# Patient Record
Sex: Female | Born: 2000 | Race: White | Hispanic: No | Marital: Single | Smoking: Never smoker
Health system: Southern US, Community
[De-identification: ages and names within clinical notes are randomized; demographics above are authoritative.]

---

## 2019-12-20 ENCOUNTER — Emergency Department (HOSPITAL_COMMUNITY)
Admission: EM | Admit: 2019-12-20 | Discharge: 2019-12-20 | Disposition: A | Discharge status: Home / Self Care | Payer: No Typology Code available for payment source | Attending: Emergency Medicine | Admitting: Emergency Medicine

## 2019-12-20 ENCOUNTER — Encounter (HOSPITAL_COMMUNITY): Payer: Self-pay | Admitting: Emergency Medicine

## 2019-12-20 ENCOUNTER — Other Ambulatory Visit: Payer: Self-pay

## 2019-12-20 ENCOUNTER — Emergency Department (HOSPITAL_COMMUNITY): Payer: No Typology Code available for payment source

## 2019-12-20 DIAGNOSIS — T407X5A Adverse effect of cannabis (derivatives), initial encounter: Secondary | ICD-10-CM | POA: Diagnosis not present

## 2019-12-20 DIAGNOSIS — R002 Palpitations: Secondary | ICD-10-CM | POA: Diagnosis present

## 2019-12-20 DIAGNOSIS — R443 Hallucinations, unspecified: Secondary | ICD-10-CM | POA: Diagnosis not present

## 2019-12-20 MED ORDER — SODIUM CHLORIDE 0.9% FLUSH: 3.0000 mL | Freq: Once | INTRAVENOUS | Status: DC

## 2019-12-20 NOTE — ED Provider Notes (Signed)
[facility] DEPT Provider Note   CSN: [id number] Arrival date & time: 12/20/19  0032     History Chief Complaint  Patient presents with  . Medication Reaction    Anita Barrett is a 19 y.o. adult.  The history is provided by the patient and medical records.    19 y.o. F here with paranoia and anxiety.  States she ate some THC gummies from [place] with friend tonight and now feels "very high".  She came in because she states "I know they sold me the wrong item and I want a test to prove that so I can sue them".  She reports palpitations, paranoia, and some mild hallucinations.  States she has felt this way before from large dose of THC.  She denies any other coingestions.  She checked in with friend who was with her.  History reviewed. No pertinent past medical history.  There are no problems to display for this patient.   History reviewed. No pertinent surgical history.   OB History   No obstetric history on file.     History reviewed. No pertinent family history.  Social History   Tobacco Use  . Smoking status: Never Smoker  . Smokeless tobacco: Never Used  Substance Use Topics  . Alcohol use: Never  . Drug use: Yes    Types: Marijuana    Comment: THC    Home Medications Prior to Admission medications   Medication Sig Start Date End Date Taking? Authorizing Provider  ALPRAZolam Duanne Moron) 0.5 MG tablet Take by mouth. 02/23/19 12/20/19 Yes [provider]  clindamycin-benzoyl peroxide (BENZACLIN) gel Apply topically. 02/23/19  Yes [provider]    Allergies    Patient has no known allergies.  Review of Systems   Review of Systems  Cardiovascular: Positive for palpitations.  All other systems reviewed and are negative.   Physical Exam Updated Vital Signs BP (!) 148/104 (BP Location: Left Arm)   Pulse (!) 154   Temp 99.9 F (37.7 C) (Oral)   Resp 18   Ht 5\' 6"  (1.676 m)   Wt 93.9 kg   LMP 12/20/2019    SpO2 99%   BMI 33.41 kg/m   Physical Exam Vitals and nursing note reviewed.  Constitutional:      Appearance: He is well-developed.  HENT:     Head: Normocephalic and atraumatic.  Eyes:     Conjunctiva/sclera: Conjunctivae normal.     Pupils: Pupils are equal, round, and reactive to light.  Cardiovascular:     Rate and Rhythm: Regular rhythm. Tachycardia present.     Heart sounds: Normal heart sounds.     Comments: Tachy around 130 during exam Pulmonary:     Effort: Pulmonary effort is normal. No respiratory distress.     Breath sounds: Normal breath sounds. No rhonchi.  Abdominal:     General: Bowel sounds are normal.     Palpations: Abdomen is soft.  Musculoskeletal:        General: Normal range of motion.     Cervical back: Normal range of motion.  Skin:    General: Skin is warm and dry.  Neurological:     Mental Status: He is alert and oriented to person, place, and time.     ED Results / Procedures / Treatments   Labs (all labs ordered are listed, but only abnormal results are displayed) Labs Reviewed - No data to display  EKG EKG Interpretation  Date/Time:  Sunday December 20 2019  01:01:35 EST Ventricular Rate:  143 PR Interval:    QRS Duration: 81 QT Interval:  288 QTC Calculation: 445 R Axis:   76 Text Interpretation: Sinus tachycardia Ventricular premature complex Borderline repolarization abnormality No previous ECGs available Confirmed by Paula Libra ([id number]) on 12/20/2019 1:09:54 AM   Radiology DG Chest 2 View  Result Date: 12/20/2019 CLINICAL DATA:  Chest pain EXAM: CHEST - 2 VIEW COMPARISON:  None. FINDINGS: The heart size and mediastinal contours are within normal limits. Both lungs are clear. The visualized skeletal structures are unremarkable. IMPRESSION: No active cardiopulmonary disease. Electronically Signed   By: Jonna Clark M.D.   On: 12/20/2019 01:40    Procedures Procedures (including critical care time)  Medications Ordered in  ED Medications  sodium chloride flush (NS) 0.9 % injection 3 mL (has no administration in time range)    ED Course  I have reviewed the triage vital signs and the nursing notes.  Pertinent labs & imaging results that were available during my care of the patient were reviewed by me and considered in my medical decision making (see chart for details).    MDM Rules/Calculators/A&P  19 year old female presenting to the ED after eating THC Gummies with friend.  States she came in due to "feeling very high".  She reports experiencing palpitations, paranoia, and some mild hallucinations which she is experienced in the past with marijuana.  Feels like she was given a mislabeled product and wants a drug screen to prove this.  She is tachycardic on my assessment but awake, alert, appropriately oriented.  She is able to answer questions and follow commands appropriately.  Her EKG was sinus tach, chest x-ray clear.  Feel that this is likely side effect from [facility], I have very low suspicion for ACS, PE, dissection, acute cardiac event.  I discussed with her that there is not a particular test to tell if she got an extra potent dose of THC, it will only show up that it is in her system.  She acknowledged understanding of this.  Will push oral fluids and try to control her heart rate.  4:10 AM HR better controlled at 110 (down from 150).  Patient much more calm at this time.  Feel she is stable for discharge home.  Advised to refrain from using edibles.  Can follow-up with PCP.  Return here for any new/acute changes.  Final Clinical Impression(s) / ED Diagnoses Final diagnoses:  Palpitations    Rx / DC Orders ED Discharge Orders    None       Garlon Hatchet, PA-C 12/20/19 0436    Paula Libra, MD 12/20/19 [phone]

## 2019-12-20 NOTE — ED Notes (Signed)
Pt refused iv

## 2019-12-20 NOTE — Discharge Instructions (Signed)
Recommend to refrain from eating edibles. Follow-up with your primary care doctor. Return here for any new/acute changes.

## 2019-12-20 NOTE — ED Triage Notes (Signed)
Patient is complaining of anxiety and paranoia. Patient heart rate is 140. Patient states she took some thc gummies.

## 2021-04-19 IMAGING — CR DG CHEST 2V
2 series · 2 of 2 positions shown · non-contrast
Comparison: None.

CLINICAL DATA: Chest pain

EXAM:
CHEST - 2 VIEW

[w chest pa]
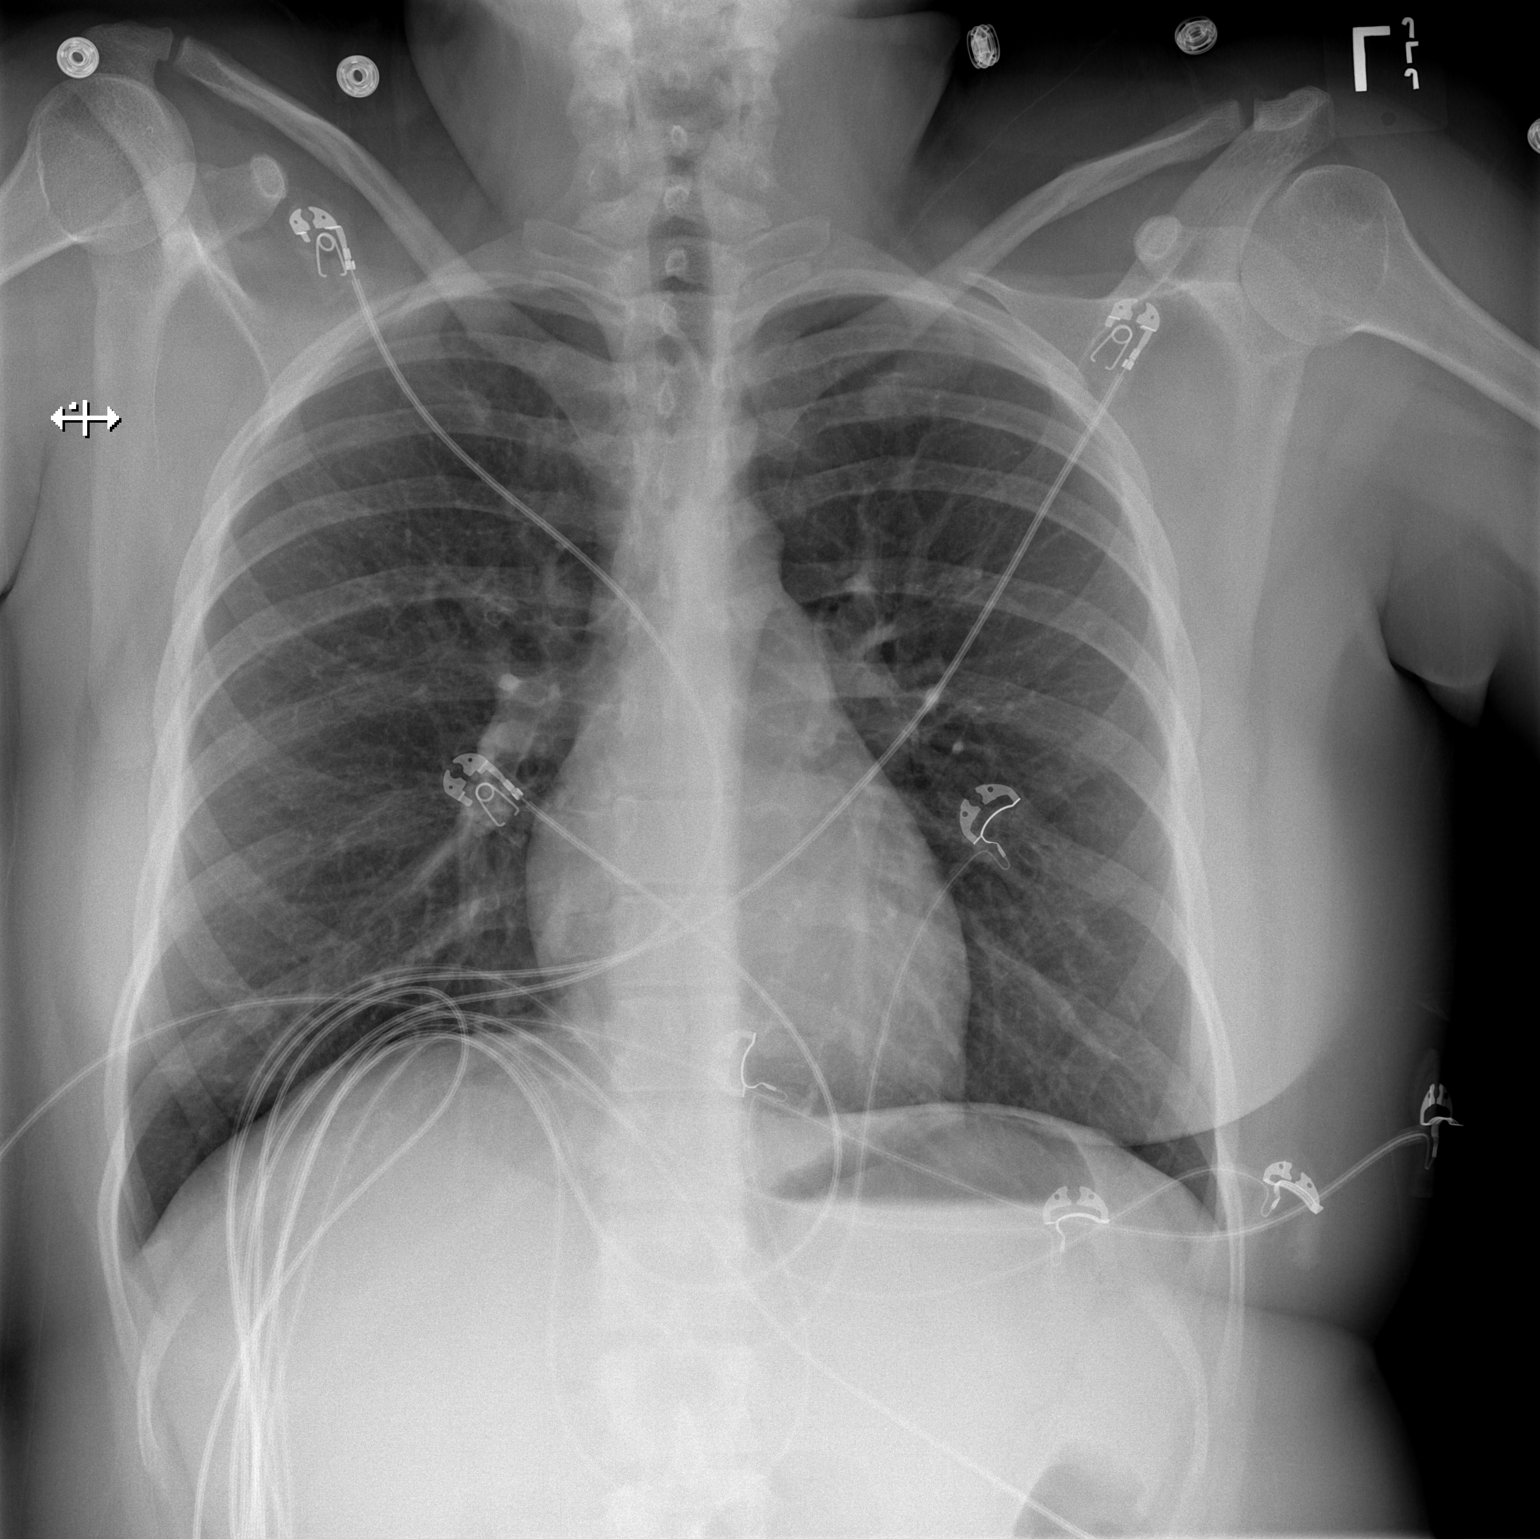

[w chest lat]
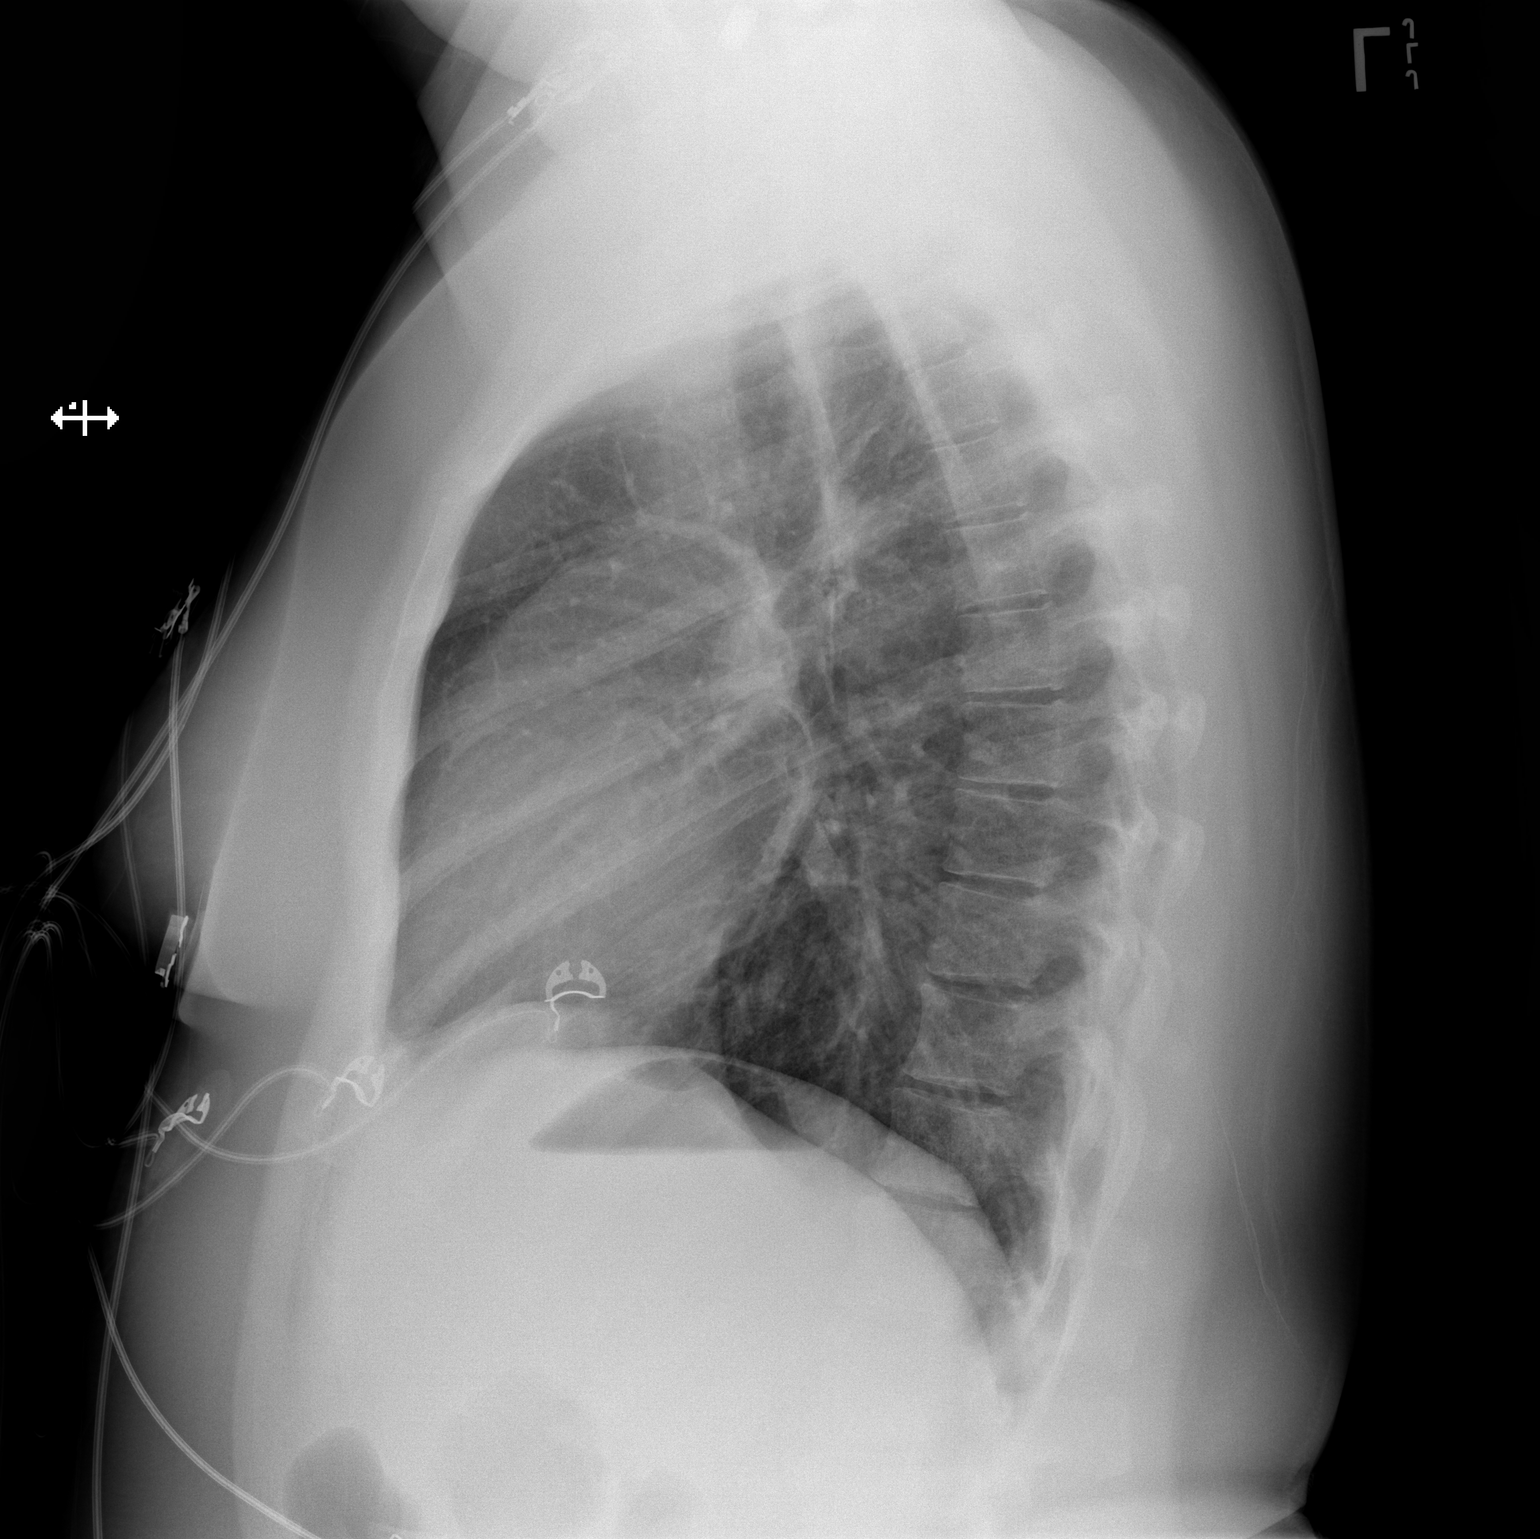

[2 of 2 positions shown; findings below may reference images not displayed]

FINDINGS: The heart size and mediastinal contours are within normal limits.
Both lungs are clear. The visualized skeletal structures are
unremarkable.
IMPRESSION: No active cardiopulmonary disease.

## 2022-03-22 ENCOUNTER — Encounter (HOSPITAL_COMMUNITY): Payer: Self-pay

## 2022-03-22 ENCOUNTER — Emergency Department (HOSPITAL_COMMUNITY)
Admission: EM | Admit: 2022-03-22 | Discharge: 2022-03-22 | Disposition: A | Discharge status: Home / Self Care | Payer: No Typology Code available for payment source | Attending: Emergency Medicine | Admitting: Emergency Medicine

## 2022-03-22 ENCOUNTER — Other Ambulatory Visit: Payer: Self-pay

## 2022-03-22 DIAGNOSIS — M5441 Lumbago with sciatica, right side: Secondary | ICD-10-CM | POA: Insufficient documentation

## 2022-03-22 DIAGNOSIS — M5440 Lumbago with sciatica, unspecified side: Secondary | ICD-10-CM

## 2022-03-22 DIAGNOSIS — R200 Anesthesia of skin: Secondary | ICD-10-CM | POA: Insufficient documentation

## 2022-03-22 DIAGNOSIS — M5442 Lumbago with sciatica, left side: Secondary | ICD-10-CM | POA: Insufficient documentation

## 2022-03-22 DIAGNOSIS — K5900 Constipation, unspecified: Secondary | ICD-10-CM | POA: Insufficient documentation

## 2022-03-22 MED ORDER — PREDNISONE 50 MG PO TABS: 50.0000 mg | ORAL_TABLET | Freq: Every day | ORAL | 0 refills | Status: DC

## 2022-03-22 NOTE — ED Triage Notes (Signed)
Pt arrived POV from home c/o lower back pain x a couple days. Pt states an urgent care diagnosed her with bilateral sciatica. Pt said they told her if she had worsening pain or numbness to come here and pt states she has some intermittent numbness and it scared her.

## 2022-03-22 NOTE — ED Provider Triage Note (Signed)
Emergency Medicine Provider Triage Evaluation Note  Anita Barrett , a 22 y.o. adult  was evaluated in triage.  Pt complains of back pain with shooting pains down her bilateral lower extremities.  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  Patient seen at urgent care on the 30th and provided with NSAIDs and muscle relaxers.  Patient states that she is having numbness in her groin, increased lower extremity weakness while walking.  Patient reports this weakness is resolved by sitting.  Patient denies bowel or bladder incontinence however does state that she has been "constipated the last couple days and not felt like I needed to use the bathroom".  Patient does not have insurance, she is concerned about the cost of receiving MRI.  Review of Systems  Positive:  Negative:   Physical Exam  BP 139/83 (BP Location: Right Arm)   Pulse 98   Temp 98.4 F (36.9 C) (Oral)   Resp 16   Ht 5\' 6"  (1.676 m)   Wt 74.8 kg   SpO2 100%   BMI 26.63 kg/m  Gen:   Awake, no distress   Resp:  Normal effort  MSK:   Moves extremities without difficulty  Other:  Decreased sensation of bilateral lower extremities left greater than right.  Medical Decision Making  Medically screening exam initiated at 3:12 PM.  Appropriate orders placed.  Louise Rawson was informed that the remainder of the evaluation will be completed by another provider, this initial triage assessment does not replace that evaluation, and the importance of remaining in the ED until their evaluation is complete.     Erich Montane, PA-C 03/22/22 1514

## 2022-03-22 NOTE — Discharge Instructions (Addendum)
Take the medications as prescribed.  Consider seeing a spine doctor if the symptoms do not improve in the next couple of weeks.  Return for issues with incontinence, persistent weakness

## 2022-03-22 NOTE — ED Provider Notes (Addendum)
[facility] EMERGENCY DEPARTMENT Provider Note   CSN: [id number] Arrival date & time: 03/22/22  1441     History  Chief Complaint  Patient presents with   Back Pain    Anita Barrett is a 21 y.o. adult.   Back Pain Associated symptoms: no fever    Patient presents to the ER for evaluation of persistent back pain.  Patient has been having pain in their lower back for few days.  Patient went to an urgent care 2 days ago and was diagnosed with bilateral sciatica.  Patient was giving warning signs and precautions.  Patient states they have been having some intermittent numbness.  Comes and goes and migrates to different spots.  They have also noticed mild constipation but no issues with incontinence.  No perianal numbness.  No weakness.  Overall the symptoms are improving from the other day however the patient was concerned about the numbness and based on the precautions from the urgent care came to the ED as directed.  Home Medications Prior to Admission medications   Medication Sig Start Date End Date Taking? Authorizing Provider  predniSONE (DELTASONE) 50 MG tablet Take 1 tablet (50 mg total) by mouth daily. 03/22/22  Yes Linwood Dibbles, MD  clindamycin-benzoyl peroxide [facility]) gel Apply 1 application topically 2 (two) times daily as needed (acne).  02/23/19   [provider]  Ferrous Gluconate (IRON 27 PO) Take 27 mg by mouth daily.    [provider]  ibuprofen (ADVIL) 200 MG tablet Take 400 mg by mouth every 6 (six) hours as needed for moderate pain.    [provider]  Multiple Vitamin (MULTIVITAMIN WITH MINERALS) TABS tablet Take 1 tablet by mouth daily.    [provider]  Probiotic Product (PROBIOTIC DAILY PO) Take 1 capsule by mouth daily.    [provider]  vitamin B-12 (CYANOCOBALAMIN) 1000 MCG tablet Take 1,000 mcg by mouth daily.    [provider]      Allergies    Patient has no known allergies.     Review of Systems   Review of Systems  Constitutional:  Negative for fever.  Musculoskeletal:  Positive for back pain.   Physical Exam Updated Vital Signs BP 139/83 (BP Location: Right Arm)   Pulse 98   Temp 98.4 F (36.9 C) (Oral)   Resp 16   Ht 1.676 m (5\' 6" )   Wt 74.8 kg   SpO2 100%   BMI 26.63 kg/m  Physical Exam Vitals and nursing note reviewed.  Constitutional:      General: He is not in acute distress.    Appearance: He is well-developed.  HENT:     Head: Normocephalic and atraumatic.     Right Ear: External ear normal.     Left Ear: External ear normal.  Eyes:     General: No scleral icterus.       Right eye: No discharge.        Left eye: No discharge.     Conjunctiva/sclera: Conjunctivae normal.  Neck:     Trachea: No tracheal deviation.  Cardiovascular:     Rate and Rhythm: Normal rate.  Pulmonary:     Effort: Pulmonary effort is normal. No respiratory distress.     Breath sounds: No stridor.  Abdominal:     General: There is no distension.  Musculoskeletal:        General: No swelling or deformity.     Cervical back: Neck supple.  Lumbar back: Tenderness present.  Skin:    General: Skin is warm and dry.     Findings: No rash.  Neurological:     Mental Status: He is alert.     Cranial Nerves: Cranial nerve deficit: no gross deficits.     Motor: No weakness, tremor, atrophy or abnormal muscle tone.     Deep Tendon Reflexes:     Reflex Scores:      Patellar reflexes are 2+ on the right side and 2+ on the left side.   ED Results / Procedures / Treatments   Labs (all labs ordered are listed, but only abnormal results are displayed) Labs Reviewed - No data to display  EKG None  Radiology No results found.  Procedures Procedures    Medications Ordered in ED Medications - No data to display  ED Course/ Medical Decision Making/ A&P                           Medical Decision Making Amount and/or Complexity of Data  Reviewed External Data Reviewed: notes.    Details: Notes reviewed from urgent care visit  Risk Prescription drug management.   Patient presented to the ED for evaluation of lower back pain.  Patient is having some radicular type symptoms.  Sounds like they were given precautions regarding cauda equina.  Patient is not actually having any incontinence issues they just have noticed somewhat of a change in the pattern of their urination.  They have had some numbness but it is transient and is in different areas.  Patient has normal sensation on exam at this time.  Patient has normal strength and reflexes.  I have very low suspicion for cauda equina.  Patient is concerned about the cost of MRI and overall I do not feel there is an emergent need to have that done at this point.  We will try a course of steroids.  Warning signs precautions discussed        Final Clinical Impression(s) / ED Diagnoses Final diagnoses:  Acute bilateral low back pain with sciatica, sciatica laterality unspecified    Rx / DC Orders ED Discharge Orders          Ordered    predniSONE (DELTASONE) 50 MG tablet  Daily        03/22/22 1634              Linwood Dibbles, MD 03/22/22 1638    Linwood Dibbles, MD 03/22/22 [phone]

## 2022-03-22 NOTE — ED Notes (Signed)
Discharge instructions reviewed with patient. Patient verbalized understanding of instructions. Follow-up care and medications were reviewed. Patient ambulatory with steady gait. VSS upon discharge.  ?

## 2022-09-26 DIAGNOSIS — F4310 Post-traumatic stress disorder, unspecified: Secondary | ICD-10-CM | POA: Diagnosis not present

## 2022-10-08 DIAGNOSIS — F4310 Post-traumatic stress disorder, unspecified: Secondary | ICD-10-CM | POA: Diagnosis not present

## 2022-10-11 DIAGNOSIS — F332 Major depressive disorder, recurrent severe without psychotic features: Secondary | ICD-10-CM | POA: Diagnosis not present

## 2022-10-19 DIAGNOSIS — F4310 Post-traumatic stress disorder, unspecified: Secondary | ICD-10-CM | POA: Diagnosis not present

## 2022-10-26 DIAGNOSIS — F4310 Post-traumatic stress disorder, unspecified: Secondary | ICD-10-CM | POA: Diagnosis not present

## 2022-10-31 ENCOUNTER — Encounter: Payer: Self-pay | Admitting: Obstetrics & Gynecology

## 2022-11-02 DIAGNOSIS — F332 Major depressive disorder, recurrent severe without psychotic features: Secondary | ICD-10-CM | POA: Diagnosis not present

## 2022-11-07 ENCOUNTER — Other Ambulatory Visit: Payer: Self-pay

## 2022-11-07 ENCOUNTER — Encounter: Payer: Self-pay | Admitting: Internal Medicine

## 2022-11-07 ENCOUNTER — Ambulatory Visit (INDEPENDENT_AMBULATORY_CARE_PROVIDER_SITE_OTHER): Payer: Medicaid Other | Admitting: Internal Medicine

## 2022-11-07 VITALS — BP 123/72 | HR 75 | Temp 98.4°F | Ht 66.0 in | Wt 163.9 lb

## 2022-11-07 DIAGNOSIS — F32A Depression, unspecified: Secondary | ICD-10-CM | POA: Diagnosis not present

## 2022-11-07 DIAGNOSIS — F329 Major depressive disorder, single episode, unspecified: Secondary | ICD-10-CM

## 2022-11-07 DIAGNOSIS — G8929 Other chronic pain: Secondary | ICD-10-CM | POA: Diagnosis not present

## 2022-11-07 DIAGNOSIS — Z0000 Encounter for general adult medical examination without abnormal findings: Secondary | ICD-10-CM | POA: Diagnosis not present

## 2022-11-07 DIAGNOSIS — M545 Low back pain, unspecified: Secondary | ICD-10-CM

## 2022-11-07 NOTE — Assessment & Plan Note (Signed)
Patient has not been screened for HCV for HIV. She is wiling to get this done today. She declines any additional vaccinations at this time. Patient is overdue for PAP smear,but this will need to be followed up on at later date due to time constraints.  Will check HCV and HIV today.
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
## 2022-11-07 NOTE — Progress Notes (Signed)
   CC: new to establish, back pain  HPI:[name] is a 22 y.o. adult who presents for evaluation of back pain. Please see individual problem based A/P for details.  Depression, PHQ-9: Based on the patients  [place] Visit from 11/07/2022 in [place]  PHQ-9 Total Score 12      score we have .  No past medical history on file. Review of Systems:   See HPI  FamHx: Father died of bladder cancer and had DM2, mom and maternal gmother had skin cancer.  Says cousin has hashimotos Says there is a family hx of arthirits Grandmother has ovarian cysts. No hx endometriosis  Physical Exam: Vitals:   11/07/22 1040  BP: 123/72  Pulse: 75  Temp: 98.4 F (36.9 C)  TempSrc: Oral  SpO2: 100%  Weight: 163 lb 14.4 oz (74.3 kg)  Height: 5\' 6"  (1.676 m)   On exam, she is alert and oriented. Not in acute distress. Heart is RR without MRG. Lungs are CTAB. MSK no obvious deformities. ROM is normal. She does have bilateral paraspinal muscle tenderness. Strength testing in lower extremities 5/5 throughout. Sensation to gross touch intact. Patellar reflexes 2+. Able to rise from chair and transfer to bench without assistance. Able to get onto bench without the pull-out step.   Assessment & Plan:   See Encounters Tab for problem based charting.  Patient discussed with Dr. Angelia Mould

## 2022-11-07 NOTE — Patient Instructions (Addendum)
Dear Anita Barrett,  Thank you for trusting [place] with your care today.  We discussed your back pain, depression, and some healthcare maintenance items.  For your back pain, we recommend trying NSAIDs to help with pain control for now. Physical therapy can also help with your pain and it will give [place] a better understand of what type of back issues you are dealing with. This will help [place] determine the best treatment course for your back pain.  For your depression, we recommend being evaluated by a psychologist. They can help [place] uncover potential diagnoses and recommend the best treatments for you.   We will check two lab tests on you today.  We would like to see you back in the clinic in 3 months to follow up on these issues.

## 2022-11-07 NOTE — Assessment & Plan Note (Signed)
Patient endorses a history of lower back pain for which she was seen at [facility] and told she had pre-arthritis and sciatica, later went to ED 6/23 due to warning signs told to her at [facility]. At ED visit, she was endorsing lower back pain, intermittent migrating numbness, mild constipation without perianal numbness or incontinence, and no weakness. EDP examination discovered simple change in urination pattern and normal neuro exam. Concern for cauda equina or need for MRI at that time was low and she was discharged home with instructions to continue the prednisone that she had been given at prior UC visit.  Today, patient reports she always has pain in both hips with flares provoked by changes in weather. Pain currently 3/10 in bilateral lower back and does not radiate. No pain shooting down her legs currently. She does not feel like she is having any numbness or tingling. She does feel like she has a lot of muscle tension and spasms. She has been using tylenol, extra strength aleve, CBD and hemp products to help. She has been using a cane due to concern about falling but does not have any unsteadiness today.   On exam, she is alert and oriented. Not in acute distress. Heart is RR without MRG. Lungs are CTAB. MSK no obvious deformities. ROM is normal. She does have bilateral paraspinal muscle tenderness. Strength testing in lower extremities 5/5 throughout. Sensation to gross touch intact. Patellar reflexes 2+. Able to rise from chair and transfer to bench without assistance. Able to get onto bench without the pull-out step.   Patient does not have any red flag signs on her physical exam. Everything thus far does seem more consistent with muscle spasm, however, she is in age range where inflammatory back problem should not be excluded. I have recommended patient see PT. If she does not demonstrate much improvement with PT, can consider further workup for inflammatory spondyloarthropathy. I also think patient would  benefit from NSAID and tylenol in the meantime. We will re-evaluate her in 3 months after she has been able to work with PT for some time.

## 2022-11-08 LAB — HCV AB W REFLEX TO QUANT PCR: HCV Ab: NONREACTIVE

## 2022-11-08 LAB — HCV INTERPRETATION

## 2022-11-08 LAB — HIV ANTIBODY (ROUTINE TESTING W REFLEX): HIV Screen 4th Generation wRfx: NONREACTIVE

## 2022-11-09 ENCOUNTER — Encounter: Payer: Self-pay | Admitting: Internal Medicine

## 2022-11-09 DIAGNOSIS — F332 Major depressive disorder, recurrent severe without psychotic features: Secondary | ICD-10-CM | POA: Diagnosis not present

## 2022-11-12 NOTE — Progress Notes (Signed)
Internal Medicine Clinic Attending  Case discussed with the resident at the time of the visit.  We reviewed the resident's history and exam and pertinent patient test results.  I agree with the assessment, diagnosis, and plan of care documented in the resident's note.  

## 2022-11-14 ENCOUNTER — Telehealth: Payer: Self-pay | Admitting: Internal Medicine

## 2022-11-14 NOTE — Telephone Encounter (Signed)
Returned call to the patient in reference to her  Psychology Referral.  Please see the message below were you referred her to as she states  [place]     Attachment      -  Note: ----- Message ----- From: Lindajo Royal Sent: 11/07/2022  11:49 AM EST To: Delene Ruffini, MD   Dr. Elliot Gurney,    Hello, we are not in network with this patients insurance, you might try the [place] office on [address] in [place] that Dept is [facility]  or [employer].    Take Care

## 2022-11-19 DIAGNOSIS — H5213 Myopia, bilateral: Secondary | ICD-10-CM | POA: Diagnosis not present

## 2022-11-23 DIAGNOSIS — F332 Major depressive disorder, recurrent severe without psychotic features: Secondary | ICD-10-CM | POA: Diagnosis not present

## 2022-11-28 ENCOUNTER — Encounter: Payer: Self-pay | Admitting: Physical Therapy

## 2022-11-28 ENCOUNTER — Ambulatory Visit
Payer: Medicaid Other | Attending: Student in an Organized Health Care Education/Training Program | Admitting: Physical Therapy

## 2022-11-28 ENCOUNTER — Other Ambulatory Visit: Payer: Self-pay

## 2022-11-28 DIAGNOSIS — M5459 Other low back pain: Secondary | ICD-10-CM | POA: Diagnosis not present

## 2022-11-28 DIAGNOSIS — G8929 Other chronic pain: Secondary | ICD-10-CM | POA: Diagnosis not present

## 2022-11-28 DIAGNOSIS — M545 Low back pain, unspecified: Secondary | ICD-10-CM | POA: Insufficient documentation

## 2022-11-28 DIAGNOSIS — M6281 Muscle weakness (generalized): Secondary | ICD-10-CM | POA: Insufficient documentation

## 2022-11-28 DIAGNOSIS — M357 Hypermobility syndrome: Secondary | ICD-10-CM | POA: Diagnosis not present

## 2022-11-28 DIAGNOSIS — M25552 Pain in left hip: Secondary | ICD-10-CM | POA: Insufficient documentation

## 2022-11-28 DIAGNOSIS — M25551 Pain in right hip: Secondary | ICD-10-CM | POA: Insufficient documentation

## 2022-11-28 NOTE — Patient Instructions (Signed)
Access Code: [email or web address] URL: [email or web address] Date: 11/28/2022 Prepared by: Hilda Blades  Exercises - Supine Posterior Pelvic Tilt  - 1 x daily - 2 sets - 10 reps - 2 breathes hold - Supine March with Posterior Pelvic Tilt  - 1 x weekly - 2 sets - 10 reps - Clam with Resistance  - 1 x daily - 2 sets - 10 reps

## 2022-11-28 NOTE — Therapy (Signed)
OUTPATIENT PHYSICAL THERAPY EVALUATION   Patient Name: Anita Barrett MRN: [id number] DOB:03-Jul-2001, 22 y.o., adult Today's Date: 11/28/2022   END OF SESSION:  PT End of Session - 11/28/22 1221     Visit Number 1    Number of Visits 13    Date for PT Re-Evaluation 01/23/23    Authorization Type [employer]    PT Start Time 0930    PT Stop Time 1015    PT Time Calculation (min) 45 min    Activity Tolerance Patient tolerated treatment well    Behavior During Therapy [facility] for tasks assessed/performed             History reviewed. No pertinent past medical history. History reviewed. No pertinent surgical history. Patient Active Problem List   Diagnosis Date Noted   Chronic lower back pain 11/07/2022   Depression 11/07/2022   Healthcare maintenance 11/07/2022    PCP: Delene Ruffini, MD  REFERRING PROVIDER: Axel Filler, MD  REFERRING DIAG: Chronic bilateral low back pain, unspecified whether sciatica present  Rationale for Evaluation and Treatment: Rehabilitation  THERAPY DIAG:  Other low back pain  Pain in left hip  Pain in right hip  Muscle weakness (generalized)  Hypermobility syndrome  ONSET DATE: ongoing for many years, no specific onset day   SUBJECTIVE SUBJECTIVE STATEMENT: Patient reports low back and bilateral hip pain that has been going on pretty much whole life and then started getting worse in 2021. Felt like rest would alleviate the pain in the past, but now it is constant and getting worse. Pain currently feels very deep and takes Tylenol and using cream without much help. 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 Denies any treatment in the past other than chiropractor, which did not help. Patient reports that stress seems to flare up the pain,  and reports has to be careful how the spine bends due to pain. Patient reports using cane since 2021 on/off, it helps to soften the impact of steps because if spine is feeling tender then hurts to take unsupported step.   PERTINENT HISTORY:  Depression  PAIN:  Are you having pain? Yes:  NPRS scale: 5-6/10 (on average can be 3-4/10 but currently worse due to periods) Pain location: Low back, bilateral SI and hips Pain description: Deep, constant, aching, pressure Aggravating factors: Stress, spinal movement or bending, walking up stairs or incline changes, when on periods Relieving factors: Tylenol, creams, CBD products  PRECAUTIONS: None  WEIGHT BEARING RESTRICTIONS: No  FALLS:  Has patient fallen in last 6 months? Yes. Number of falls 1 - reported due to orthostatic hypotension per PCP  PLOF: Independent  PATIENT GOALS: Get rid of pain, improve walking   OBJECTIVE:  PATIENT SURVEYS:  Modified Oswestry 40% disability (20/50)   SCREENING FOR RED FLAGS: Negative  COGNITION: Overall cognitive status: Within functional limits for tasks assessed     SENSATION: WFL  MUSCLE LENGTH: Patient demonstrates a high score on Beighton scale  POSTURE:   Patient tends to externally rotate bilateral hips excessively at rest  PALPATION: Tender to palpation bilateral PSIS and SIJ region  LUMBAR ROM:   Lumbar AROM grossly WFL / excessive mobility  LOWER EXTREMITY ROM:    Patient demonstrates excessive hip ER  LOWER EXTREMITY MMT:    MMT Right eval  Left eval  Hip flexion 4 4  Hip extension 4- 4-  Hip abduction 4- 4-  Knee flexion 5 5  Knee extension 5 5   (Blank rows = not tested)  LUMBAR SPECIAL TESTS:  Radicular testing negative Hypermobility with CPAs and   FUNCTIONAL TESTS:  Patient reports low back and hip pain, and feeling of instability with table top hold  Patient able to perform double limb lower test to approximately 60 deg prior to onset of low back pain  and loss of lumbar control  GAIT: Assistive device utilized: Single point cane Level of assistance: Modified independence Comments: patient using SPC, states will switch hands based on which hip/ow back is hurting   TODAY'S TREATMENT:           [facility] Adult PT Treatment:                                                DATE: 11/28/2022 Therapeutic Exercise: Posterior pelvic tilt holding for 2 breathes x 5 Posterior pelvic tilt with alternating march x 5 Side clamshell with red x 10 each  PATIENT EDUCATION:  Education details: Exam findings, POC, HEP, follow-up with PCP about referral for hypermobility syndrome Person educated: Patient Education method: Explanation, Demonstration, Tactile cues, Verbal cues, and Handouts Education comprehension: verbalized understanding, returned demonstration, verbal cues required, tactile cues required, and needs further education  HOME EXERCISE PROGRAM: Access Code: [email or web address]    ASSESSMENT: CLINICAL IMPRESSION: Patient was seen today for physical therapy evaluation and treatment for chronic low back and bilateral hip pain. Patient demonstrates possible undiagnosed hypermobility syndrome with high score on beighton scale and clear excessive motion of lumbar and hip joints. Reports presence of L6, unable to verify without imaging in chart. Patient exhibits gross strength and control deficits of the spine and hips, and uses a SPC for ambulation due to pain with impact while walking. Patient provided exercises to initiate core activation and lumbar control, as well as hip strengthening.   OBJECTIVE IMPAIRMENTS: decreased activity tolerance, decreased strength, improper body mechanics, and pain.   ACTIVITY LIMITATIONS: carrying, lifting, bending, sitting, standing, squatting, sleeping, stairs, and locomotion level  PARTICIPATION LIMITATIONS: meal prep, cleaning, laundry, driving, shopping, and community activity  PERSONAL FACTORS: Fitness, Past/current  experiences, Social background, and Time since onset of injury/illness/exacerbation are also affecting patient's functional outcome.   REHAB POTENTIAL: Good  CLINICAL DECISION MAKING: Stable/uncomplicated  EVALUATION COMPLEXITY: Low   GOALS: Goals reviewed with patient? Yes  SHORT TERM GOALS: Target date: 12/26/2022  Patient will be I with initial HEP in order to progress with therapy. Baseline: HEP provided at eval Goal status: INITIAL  2.  Patient will be able to ambulate without using SPC in order to reduce functional limitations and improve community access Baseline: patient ambulates with SPC due to pain Goal status: INITIAL  3.  Patient will report pain </= 3/10 with walking and activity in order to improve ability to perform household tasks without limitations Baseline: 5-6/10 pain at eval Goal status: INITIAL  LONG TERM GOALS: Target date: 01/23/2023  Patient will be I with final HEP to maintain progress from PT. Baseline: HEP provided at eval Goal status: INITIAL  2.  Patient will report modified ODI </= 20% disability in order to indicate an improvement in functional ability Baseline: 40% functional disability Goal status: INITIAL  3.  Patient  will demonstrate hip strength grossly >/= 4+/5 MMT in order to improve walking ability and decrease pain with stairs Baseline: hip strength grossly 4-/5 MMT Goal status: INITIAL  4.  Patient will demonstrate double limb lowering test to approximately 30 deg with proper control and without increased pain or feeling of instability in order to indicate improve core and spinal control and reduce pain with activity Baseline:  Goal status: INITIAL   PLAN: PT FREQUENCY: 1-2x/week  PT DURATION: 8 weeks  PLANNED INTERVENTIONS: Therapeutic exercises, Therapeutic activity, Neuromuscular re-education, Balance training, Gait training, Patient/Family education, Self Care, Joint mobilization, Aquatic Therapy, Dry Needling, Electrical  stimulation, Cryotherapy, Moist heat, Taping, Ionotophoresis 4mg /ml Dexamethasone, Manual therapy, and Re-evaluation.  PLAN FOR NEXT SESSION: Review HEP and progress PRN, continue assessment for hypermobility as needed, focus on core stabilization and hip strengthening/control, pilates?   Hilda Blades, PT, DPT, LAT, ATC 11/28/22  12:22 PM Phone: [phone] Fax: [phone]

## 2022-12-07 DIAGNOSIS — F332 Major depressive disorder, recurrent severe without psychotic features: Secondary | ICD-10-CM | POA: Diagnosis not present

## 2022-12-12 ENCOUNTER — Ambulatory Visit: Payer: Medicaid Other | Admitting: Physical Therapy

## 2022-12-12 ENCOUNTER — Encounter: Payer: Self-pay | Admitting: Physical Therapy

## 2022-12-12 DIAGNOSIS — G8929 Other chronic pain: Secondary | ICD-10-CM | POA: Diagnosis not present

## 2022-12-12 DIAGNOSIS — M25551 Pain in right hip: Secondary | ICD-10-CM | POA: Diagnosis not present

## 2022-12-12 DIAGNOSIS — M5459 Other low back pain: Secondary | ICD-10-CM | POA: Diagnosis not present

## 2022-12-12 DIAGNOSIS — M545 Low back pain, unspecified: Secondary | ICD-10-CM | POA: Diagnosis not present

## 2022-12-12 DIAGNOSIS — M25552 Pain in left hip: Secondary | ICD-10-CM

## 2022-12-12 DIAGNOSIS — M357 Hypermobility syndrome: Secondary | ICD-10-CM | POA: Diagnosis not present

## 2022-12-12 DIAGNOSIS — M6281 Muscle weakness (generalized): Secondary | ICD-10-CM

## 2022-12-12 NOTE — Therapy (Signed)
OUTPATIENT PHYSICAL THERAPY TREATMENT   Patient Name: Anita Barrett MRN: [id number] DOB:02-Jan-2001, 22 y.o., adult Today's Date: 12/12/2022   END OF SESSION:  PT End of Session - 12/12/22 0935     Visit Number 2    Number of Visits 13    Date for PT Re-Evaluation 01/23/23    Authorization Type [employer]    PT Start Time 0933    PT Stop Time 1015    PT Time Calculation (min) 42 min    Activity Tolerance Patient tolerated treatment well    Behavior During Therapy [facility] for tasks assessed/performed             History reviewed. No pertinent past medical history. History reviewed. No pertinent surgical history. Patient Active Problem List   Diagnosis Date Noted   Chronic lower back pain 11/07/2022   Depression 11/07/2022   Healthcare maintenance 11/07/2022    PCP: Delene Ruffini, MD  REFERRING PROVIDER: Axel Filler, MD  REFERRING DIAG: Chronic bilateral low back pain, unspecified whether sciatica present  Rationale for Evaluation and Treatment: Rehabilitation  THERAPY DIAG:  Other low back pain  Pain in left hip  Pain in right hip  Muscle weakness (generalized)  Hypermobility syndrome  ONSET DATE: ongoing for many years, no specific onset day   SUBJECTIVE SUBJECTIVE STATEMENT: Patient reports no need for cane lately as his pain is 2/10. Completes the HEP as directed but not sure if they are helping .     PERTINENT HISTORY:  Depression Hypermobility   PAIN:  Are you having pain? Yes:  NPRS scale: 5-6/10 (on average can be 3-4/10 but currently worse due to periods) Pain location: Low back, bilateral SI and hips Pain description: Deep, constant, aching, pressure Aggravating factors: Stress, spinal movement or bending, walking up stairs or incline changes, when on periods Relieving factors: Tylenol, creams, CBD products  PRECAUTIONS: None  WEIGHT BEARING RESTRICTIONS: No  FALLS:  Has patient fallen in last 6 months? Yes. Number  of falls 1 - reported due to orthostatic hypotension per PCP  PLOF: Independent  PATIENT GOALS: Get rid of pain, improve walking   OBJECTIVE:  PATIENT SURVEYS:  Modified Oswestry 40% disability (20/50)   SCREENING FOR RED FLAGS: Negative  COGNITION: Overall cognitive status: Within functional limits for tasks assessed     SENSATION: WFL  MUSCLE LENGTH: Patient demonstrates a high score on Beighton scale  POSTURE:   Patient tends to externally rotate bilateral hips excessively at rest  PALPATION: Tender to palpation bilateral PSIS and SIJ region  LUMBAR ROM:   Lumbar AROM grossly WFL / excessive mobility  LOWER EXTREMITY ROM:    Patient demonstrates excessive hip ER  LOWER EXTREMITY MMT:    MMT Right eval Left eval  Hip flexion 4 4  Hip extension 4- 4-  Hip abduction 4- 4-  Knee flexion 5 5  Knee extension 5 5   (Blank rows = not tested)  LUMBAR SPECIAL TESTS:  Radicular testing negative Hypermobility with CPAs   FUNCTIONAL TESTS:  Patient reports low back and hip pain, and feeling of instability with table top hold  Patient able to perform double limb lower test to approximately 60 deg prior to onset of low back pain and loss of lumbar control  GAIT: Assistive device utilized: Single point cane Level of assistance: Modified independence Comments: patient using SPC, states will switch hands based on which hip/ow back is hurting   TODAY'S TREATMENT:  [place] Adult PT Treatment:                                                DATE: 12/12/22 Therapeutic Exercise: Lumbar stabilization TrA  Tr A with march and bent knee fall out, used ball under hips  Sidelying clam pillow between knees x 20 no band  Quadruped TrA  Quadruped Arm lift  Bird dog x 5 Wall squat  Wall sit 30 sec  Springboard shoulder extension x 10 Sidefacing shoulder adduction x 6 each   Bridging x 5 articulation then flat back x 5  Self Care: Neutral spine    [facility] Adult  PT Treatment:                                                DATE: 11/28/2022 Therapeutic Exercise: Posterior pelvic tilt holding for 2 breathes x 5 Posterior pelvic tilt with alternating march x 5 Side clamshell with red x 10 each  PATIENT EDUCATION:  Education details: Exam findings, POC, HEP, follow-up with PCP about referral for hypermobility syndrome Person educated: Patient Education method: Explanation, Demonstration, Tactile cues, Verbal cues, and Handouts Education comprehension: verbalized understanding, returned demonstration, verbal cues required, tactile cues required, and needs further education  HOME EXERCISE PROGRAM: Access Code: [email or web address]  Access Code: [email or web address] URL: [email or web address] Date: 12/12/2022 Prepared by: Raeford Razor  Exercises - Supine Transversus Abdominis Bracing - Hands on Stomach  - 1 x daily - 7 x weekly - 1 sets - 10 reps - 5 hold - Supine Transversus Abdominis Bracing with Leg Extension  - 1 x daily - 7 x weekly - 2 sets - 10 reps - Supine Transversus Abdominis Bracing with Double Leg Fallout  - 1 x daily - 7 x weekly - 2 sets - 10 reps - Quadruped Transversus Abdominis Bracing  - 1 x daily - 7 x weekly - 1 sets - 5 reps - 5-10 hold - Quadruped on Forearms Hip Extension  - 1 x daily - 7 x weekly - 2 sets - 10 reps - 5 hold - Clam with Resistance  - 1 x daily - 2 sets - 10 reps  ASSESSMENT: CLINICAL IMPRESSION: Focused on stability and deep core activation to prepare for more dynamic movements.  Modified HEP to provide variation and challenge to stability and proprioception.  Posterior pelvic tilting increased Rt sided back pain a bit today. Able to correct easily when cued.   OBJECTIVE IMPAIRMENTS: decreased activity tolerance, decreased strength, improper body mechanics, and pain.   ACTIVITY LIMITATIONS: carrying, lifting, bending, sitting, standing, squatting, sleeping, stairs, and locomotion level  PARTICIPATION LIMITATIONS: meal  prep, cleaning, laundry, driving, shopping, and community activity  PERSONAL FACTORS: Fitness, Past/current experiences, Social background, and Time since onset of injury/illness/exacerbation are also affecting patient's functional outcome.   REHAB POTENTIAL: Good  CLINICAL DECISION MAKING: Stable/uncomplicated  EVALUATION COMPLEXITY: Low   GOALS: Goals reviewed with patient? Yes  SHORT TERM GOALS: Target date: 12/26/2022  Patient will be I with initial HEP in order to progress with therapy. Baseline: HEP provided at eval Goal status: INITIAL  2.  Patient will be able to ambulate without using SPC in order to reduce functional limitations and improve community access Baseline: patient  ambulates with SPC due to pain Goal status: INITIAL  3.  Patient will report pain </= 3/10 with walking and activity in order to improve ability to perform household tasks without limitations Baseline: 5-6/10 pain at eval Goal status: INITIAL  LONG TERM GOALS: Target date: 01/23/2023  Patient will be I with final HEP to maintain progress from PT. Baseline: HEP provided at eval Goal status: INITIAL  2.  Patient will report modified ODI </= 20% disability in order to indicate an improvement in functional ability Baseline: 40% functional disability Goal status: INITIAL  3.  Patient will demonstrate hip strength grossly >/= 4+/5 MMT in order to improve walking ability and decrease pain with stairs Baseline: hip strength grossly 4-/5 MMT Goal status: INITIAL  4.  Patient will demonstrate double limb lowering test to approximately 30 deg with proper control and without increased pain or feeling of instability in order to indicate improve core and spinal control and reduce pain with activity Baseline:  Goal status: INITIAL   PLAN: PT FREQUENCY: 1-2x/week  PT DURATION: 8 weeks  PLANNED INTERVENTIONS: Therapeutic exercises, Therapeutic activity, Neuromuscular re-education, Balance training, Gait  training, Patient/Family education, Self Care, Joint mobilization, Aquatic Therapy, Dry Needling, Electrical stimulation, Cryotherapy, Moist heat, Taping, Ionotophoresis 38m/ml Dexamethasone, Manual therapy, and Re-evaluation.  PLAN FOR NEXT SESSION: Review HEP and progress PRN, continue assessment for hypermobility as needed, focus on core stabilization and hip strengthening/control, pilates?  JRaeford Razor PT 12/12/22 2:22 PM Phone: [phone]: [phone]

## 2022-12-14 DIAGNOSIS — F4310 Post-traumatic stress disorder, unspecified: Secondary | ICD-10-CM | POA: Diagnosis not present

## 2022-12-19 ENCOUNTER — Encounter: Payer: Self-pay | Admitting: Physical Therapy

## 2022-12-19 ENCOUNTER — Ambulatory Visit: Payer: Medicaid Other | Admitting: Physical Therapy

## 2022-12-19 DIAGNOSIS — M6281 Muscle weakness (generalized): Secondary | ICD-10-CM | POA: Diagnosis not present

## 2022-12-19 DIAGNOSIS — F332 Major depressive disorder, recurrent severe without psychotic features: Secondary | ICD-10-CM | POA: Diagnosis not present

## 2022-12-19 DIAGNOSIS — M357 Hypermobility syndrome: Secondary | ICD-10-CM

## 2022-12-19 DIAGNOSIS — M25551 Pain in right hip: Secondary | ICD-10-CM

## 2022-12-19 DIAGNOSIS — M5459 Other low back pain: Secondary | ICD-10-CM

## 2022-12-19 DIAGNOSIS — M545 Low back pain, unspecified: Secondary | ICD-10-CM | POA: Diagnosis not present

## 2022-12-19 DIAGNOSIS — M25552 Pain in left hip: Secondary | ICD-10-CM | POA: Diagnosis not present

## 2022-12-19 DIAGNOSIS — G8929 Other chronic pain: Secondary | ICD-10-CM | POA: Diagnosis not present

## 2022-12-19 NOTE — Therapy (Signed)
OUTPATIENT PHYSICAL THERAPY TREATMENT   Patient Name: Anita Barrett MRN: [id number] DOB:07/30/2001, 22 y.o., adult Today's Date: 12/19/2022   END OF SESSION:  PT End of Session - 12/19/22 0932     Visit Number 3    Number of Visits 13    Date for PT Re-Evaluation 01/23/23    Authorization Type [employer]    PT Start Time 0930    PT Stop Time 1015    PT Time Calculation (min) 45 min    Activity Tolerance Patient tolerated treatment well    Behavior During Therapy [facility] for tasks assessed/performed              History reviewed. No pertinent past medical history. History reviewed. No pertinent surgical history. Patient Active Problem List   Diagnosis Date Noted   Chronic lower back pain 11/07/2022   Depression 11/07/2022   Healthcare maintenance 11/07/2022    PCP: Delene Ruffini, MD  REFERRING PROVIDER: Axel Filler, MD  REFERRING DIAG: Chronic bilateral low back pain, unspecified whether sciatica present  Rationale for Evaluation and Treatment: Rehabilitation  THERAPY DIAG:  Other low back pain  Pain in left hip  Pain in right hip  Muscle weakness (generalized)  Hypermobility syndrome  ONSET DATE: ongoing for many years, no specific onset day   SUBJECTIVE SUBJECTIVE STATEMENT: Soreness 4/10-5/10 in L SIJ. Had to walk a lot on Friday and ever since then it is hurting .     PERTINENT HISTORY:  Depression Hypermobility   PAIN:  Are you having pain? Yes:  NPRS scale: 5-6/10 (on average can be 3-4/10 but currently worse due to periods) Pain location: Low back, bilateral SI and hips Pain description: Deep, constant, aching, pressure Aggravating factors: Stress, spinal movement or bending, walking up stairs or incline changes, when on periods Relieving factors: Tylenol, creams, CBD products  PRECAUTIONS: None  WEIGHT BEARING RESTRICTIONS: No  FALLS:  Has patient fallen in last 6 months? Yes. Number of falls 1 - reported due to  orthostatic hypotension per PCP  PLOF: Independent  PATIENT GOALS: Get rid of pain, improve walking   OBJECTIVE:  PATIENT SURVEYS:  Modified Oswestry 40% disability (20/50)   SCREENING FOR RED FLAGS: Negative  COGNITION: Overall cognitive status: Within functional limits for tasks assessed     SENSATION: WFL  MUSCLE LENGTH: Patient demonstrates a high score on Beighton scale  POSTURE:   Patient tends to externally rotate bilateral hips excessively at rest  PALPATION: Tender to palpation bilateral PSIS and SIJ region  LUMBAR ROM:   Lumbar AROM grossly WFL / excessive mobility  LOWER EXTREMITY ROM:    Patient demonstrates excessive hip ER  LOWER EXTREMITY MMT:    MMT Right eval Left eval  Hip flexion 4 4  Hip extension 4- 4-  Hip abduction 4- 4-  Knee flexion 5 5  Knee extension 5 5   (Blank rows = not tested)  LUMBAR SPECIAL TESTS:  Radicular testing negative Hypermobility with CPAs   FUNCTIONAL TESTS:  Patient reports low back and hip pain, and feeling of instability with table top hold  Patient able to perform double limb lower test to approximately 60 deg prior to onset of low back pain and loss of lumbar control  GAIT: Assistive device utilized: Single point cane Level of assistance: Modified independence Comments: patient using SPC, states will switch hands based on which hip/ow back is hurting   TODAY'S TREATMENT:           [facility] Adult  PT Treatment:                                                DATE: 12/19/22 Therapeutic Exercise: Tr A x 10 used ball under pelvis for stability exercises Double leg clam x 10  Single leg alt x 10  Supine march x 10 alt  Supine 90/90 hold then toe tap Banded toe taps from 90/90 Bridging with blue band x 10  Supine double leg clam then single leg blue band Sidelying clam no band  Quadruped Tr A x 5 added LE extension x 5 then donkey lift x 10 each LE (hip ext/knee flex) Manual Therapy: Sidelying manual Lt L5  -S1 soft tissue work and gentle tractioning to L pelvis to elongate  Self care:  body braid, bracing options    [facility] Adult PT Treatment:                                                DATE: 12/12/22 Therapeutic Exercise: Lumbar stabilization TrA  Tr A with march and bent knee fall out, used ball under hips  Sidelying clam pillow between knees x 20 no band  Quadruped TrA  Quadruped Arm lift  Bird dog x 5 Wall squat  Wall sit 30 sec  Springboard shoulder extension x 10 Sidefacing shoulder adduction x 6 each   Bridging x 5 articulation then flat back x 5  Self Care: Neutral spine    [facility] Adult PT Treatment:                                                DATE: 11/28/2022 Therapeutic Exercise: Posterior pelvic tilt holding for 2 breathes x 5 Posterior pelvic tilt with alternating march x 5 Side clamshell with red x 10 each  PATIENT EDUCATION:  Education details: Exam findings, POC, HEP, follow-up with PCP about referral for hypermobility syndrome Person educated: Patient Education method: Explanation, Demonstration, Tactile cues, Verbal cues, and Handouts Education comprehension: verbalized understanding, returned demonstration, verbal cues required, tactile cues required, and needs further education  HOME EXERCISE PROGRAM: Access Code: [email or web address] URL: [email or web address] Date: 12/12/2022 Prepared by: Raeford Razor  Exercises - Supine Transversus Abdominis Bracing - Hands on Stomach  - 1 x daily - 7 x weekly - 1 sets - 10 reps - 5 hold - Supine Transversus Abdominis Bracing with Leg Extension  - 1 x daily - 7 x weekly - 2 sets - 10 reps - Supine Transversus Abdominis Bracing with Double Leg Fallout  - 1 x daily - 7 x weekly - 2 sets - 10 reps - Quadruped Transversus Abdominis Bracing  - 1 x daily - 7 x weekly - 1 sets - 5 reps - 5-10 hold - Quadruped on Forearms Hip Extension  - 1 x daily - 7 x weekly - 2 sets - 10 reps - 5 hold - Clam with Resistance  - 1 x daily -  2 sets - 10 reps -Bridge with band   ASSESSMENT: CLINICAL IMPRESSION: Patient worked on stability to address core and lumbopelvic pain. Pos Active SLR for core weakness.  Patient shows good body awareness and joint position especially when given resistance band.  Patient requests to stay with myself as a physical therapist for the remainder of their visits.    OBJECTIVE IMPAIRMENTS: decreased activity tolerance, decreased strength, improper body mechanics, and pain.   ACTIVITY LIMITATIONS: carrying, lifting, bending, sitting, standing, squatting, sleeping, stairs, and locomotion level  PARTICIPATION LIMITATIONS: meal prep, cleaning, laundry, driving, shopping, and community activity  PERSONAL FACTORS: Fitness, Past/current experiences, Social background, and Time since onset of injury/illness/exacerbation are also affecting patient's functional outcome.   REHAB POTENTIAL: Good  CLINICAL DECISION MAKING: Stable/uncomplicated  EVALUATION COMPLEXITY: Low   GOALS: Goals reviewed with patient? Yes  SHORT TERM GOALS: Target date: 12/26/2022  Patient will be I with initial HEP in order to progress with therapy. Baseline: HEP provided at eval Goal status: INITIAL  2.  Patient will be able to ambulate without using SPC in order to reduce functional limitations and improve community access Baseline: patient ambulates with SPC due to pain Goal status: INITIAL  3.  Patient will report pain </= 3/10 with walking and activity in order to improve ability to perform household tasks without limitations Baseline: 5-6/10 pain at eval Goal status: INITIAL  LONG TERM GOALS: Target date: 01/23/2023  Patient will be I with final HEP to maintain progress from PT. Baseline: HEP provided at eval Goal status: INITIAL  2.  Patient will report modified ODI </= 20% disability in order to indicate an improvement in functional ability Baseline: 40% functional disability Goal status: INITIAL  3.   Patient will demonstrate hip strength grossly >/= 4+/5 MMT in order to improve walking ability and decrease pain with stairs Baseline: hip strength grossly 4-/5 MMT Goal status: INITIAL  4.  Patient will demonstrate double limb lowering test to approximately 30 deg with proper control and without increased pain or feeling of instability in order to indicate improve core and spinal control and reduce pain with activity Baseline:  Goal status: INITIAL   PLAN: PT FREQUENCY: 1-2x/week  PT DURATION: 8 weeks  PLANNED INTERVENTIONS: Therapeutic exercises, Therapeutic activity, Neuromuscular re-education, Balance training, Gait training, Patient/Family education, Self Care, Joint mobilization, Aquatic Therapy, Dry Needling, Electrical stimulation, Cryotherapy, Moist heat, Taping, Ionotophoresis '4mg'$ /ml Dexamethasone, Manual therapy, and Re-evaluation.  PLAN FOR NEXT SESSION: Right SI belt over mobilization belt for lower back support .  review HEP and progress PRN, continue assessment for hypermobility as needed, focus on core stabilization and hip strengthening/control, pilates?  Raeford Razor, PT 12/19/22 11:29 AM Phone: [phone] Fax: [phone]

## 2022-12-25 NOTE — Therapy (Unsigned)
OUTPATIENT PHYSICAL THERAPY TREATMENT   Patient Name: Anita Barrett MRN: [id number] DOB:11/05/2000, 22 y.o., adult Today's Date: 12/26/2022   END OF SESSION:  PT End of Session - 12/26/22 0935     Visit Number 4    Number of Visits 13    Date for PT Re-Evaluation 01/23/23    Authorization Type [employer]    PT Start Time 0933    PT Stop Time 1015    PT Time Calculation (min) 42 min    Activity Tolerance Patient tolerated treatment well    Behavior During Therapy [facility] for tasks assessed/performed               History reviewed. No pertinent past medical history. History reviewed. No pertinent surgical history. Patient Active Problem List   Diagnosis Date Noted   Chronic lower back pain 11/07/2022   Depression 11/07/2022   Healthcare maintenance 11/07/2022    PCP: Delene Ruffini, MD  REFERRING PROVIDER: Axel Filler, MD  REFERRING DIAG: Chronic bilateral low back pain, unspecified whether sciatica present  Rationale for Evaluation and Treatment: Rehabilitation  THERAPY DIAG:  Other low back pain  Pain in left hip  Pain in right hip  Muscle weakness (generalized)  Hypermobility syndrome  ONSET DATE: ongoing for many years, no specific onset day   SUBJECTIVE SUBJECTIVE STATEMENT:  Pain is 3/10-4/10 today and I can tell I am about to get my cycle.    PERTINENT HISTORY:  Depression Hypermobility   PAIN:  Are you having pain? Yes:  NPRS scale: 3/10 (on average can be 3-4/10 but currently worse due to periods) Pain location: Low back, bilateral SI and hips Pain description: Deep, constant, aching, pressure Aggravating factors: Stress, spinal movement or bending, walking up stairs or incline changes, when on periods Relieving factors: Tylenol, creams, CBD products  PRECAUTIONS: None  WEIGHT BEARING RESTRICTIONS: No  FALLS:  Has patient fallen in last 6 months? Yes. Number of falls 1 - reported due to orthostatic hypotension per  PCP  PLOF: Independent  PATIENT GOALS: Get rid of pain, improve walking   OBJECTIVE:  PATIENT SURVEYS:  Modified Oswestry 40% disability (20/50)   SCREENING FOR RED FLAGS: Negative  COGNITION: Overall cognitive status: Within functional limits for tasks assessed     SENSATION: WFL  MUSCLE LENGTH: Patient demonstrates a high score on Beighton scale  POSTURE:   Patient tends to externally rotate bilateral hips excessively at rest  PALPATION: Tender to palpation bilateral PSIS and SIJ region  LUMBAR ROM:   Lumbar AROM grossly WFL / excessive mobility  LOWER EXTREMITY ROM:    Patient demonstrates excessive hip ER  LOWER EXTREMITY MMT:    MMT Right eval Left eval  Hip flexion 4 4  Hip extension 4- 4-  Hip abduction 4- 4-  Knee flexion 5 5  Knee extension 5 5   (Blank rows = not tested)  LUMBAR SPECIAL TESTS:  Radicular testing negative Hypermobility with CPAs   FUNCTIONAL TESTS:  Patient reports low back and hip pain, and feeling of instability with table top hold  Patient able to perform double limb lower test to approximately 60 deg prior to onset of low back pain and loss of lumbar control  GAIT: Assistive device utilized: Single point cane Level of assistance: Modified independence Comments: patient using SPC, states will switch hands based on which hip/low back is hurting   TODAY'S TREATMENT:             [facility] Adult PT Treatment:  DATE: 12/26/22 Therapeutic Exercise: Pilates Reformer used for LE/core strength, postural strength, lumbopelvic disassociation and core control.  Exercises included: Footwork 2 red 1 blue parallel and turnout on heels Double and single leg.  Needed towel under pelvis to increase stability, cues needed   Supine Arm work 1 red 1 yellow arcs  Feet in Straps 1 red and 1 yellow arcs , circle and frog squats  Quadruped 1 red on forearms  Knees back  Plank press  Seated Arms  /Abs 1 red facing back  Hinge  Roll down  Self care: Neutral spine, core, Pilates and SI belt  [facility] Adult PT Treatment:                                                DATE: 12/19/22 Therapeutic Exercise: Tr A x 10 used ball under pelvis for stability exercises Double leg clam x 10  Single leg alt x 10  Supine march x 10 alt  Supine 90/90 hold then toe tap Banded toe taps from 90/90 Bridging with blue band x 10  Supine double leg clam then single leg blue band Sidelying clam no band  Quadruped Tr A x 5 added LE extension x 5 then donkey lift x 10 each LE (hip ext/knee flex) Manual Therapy: Sidelying manual Lt L5 -S1 soft tissue work and gentle tractioning to L pelvis to elongate  Self care:  body braid, bracing options    [facility] Adult PT Treatment:                                                DATE: 12/12/22 Therapeutic Exercise: Lumbar stabilization TrA  Tr A with march and bent knee fall out, used ball under hips  Sidelying clam pillow between knees x 20 no band  Quadruped TrA  Quadruped Arm lift  Bird dog x 5 Wall squat  Wall sit 30 sec  Springboard shoulder extension x 10 Sidefacing shoulder adduction x 6 each   Bridging x 5 articulation then flat back x 5  Self Care: Neutral spine    [facility] Adult PT Treatment:                                                DATE: 11/28/2022 Therapeutic Exercise: Posterior pelvic tilt holding for 2 breathes x 5 Posterior pelvic tilt with alternating march x 5 Side clamshell with red x 10 each  PATIENT EDUCATION:  Education details: Exam findings, POC, HEP, follow-up with PCP about referral for hypermobility syndrome Person educated: Patient Education method: Explanation, Demonstration, Tactile cues, Verbal cues, and Handouts Education comprehension: verbalized understanding, returned demonstration, verbal cues required, tactile cues required, and needs further education  HOME EXERCISE PROGRAM: Access Code: [email or web address] URL:  [email or web address] Date: 12/12/2022 Prepared by: Raeford Razor  Exercises - Supine Transversus Abdominis Bracing - Hands on Stomach  - 1 x daily - 7 x weekly - 1 sets - 10 reps - 5 hold - Supine Transversus Abdominis Bracing with Leg Extension  - 1 x daily - 7 x weekly - 2 sets - 10 reps - Supine Transversus  Abdominis Bracing with Double Leg Fallout  - 1 x daily - 7 x weekly - 2 sets - 10 reps - Quadruped Transversus Abdominis Bracing  - 1 x daily - 7 x weekly - 1 sets - 5 reps - 5-10 hold - Quadruped on Forearms Hip Extension  - 1 x daily - 7 x weekly - 2 sets - 10 reps - 5 hold - Clam with Resistance  - 1 x daily - 2 sets - 10 reps -Bridge with band   ASSESSMENT: CLINICAL IMPRESSION: Patient did report improved stability with walking with SI belt trial.  Used to Pilates Reformer address multiple joints.  He has difficulty maintaining neutral spine with Footwork, folded towel at sacrum did help to relieve discomfort.  Began to move into a flexion based core exercise for the first time,  will monitor and assess result next visit.  There is a component of fear with movement due to the past intensity of his pain.    OBJECTIVE IMPAIRMENTS: decreased activity tolerance, decreased strength, improper body mechanics, and pain.   ACTIVITY LIMITATIONS: carrying, lifting, bending, sitting, standing, squatting, sleeping, stairs, and locomotion level  PARTICIPATION LIMITATIONS: meal prep, cleaning, laundry, driving, shopping, and community activity  PERSONAL FACTORS: Fitness, Past/current experiences, Social background, and Time since onset of injury/illness/exacerbation are also affecting patient's functional outcome.   REHAB POTENTIAL: Good  CLINICAL DECISION MAKING: Stable/uncomplicated  EVALUATION COMPLEXITY: Low   GOALS: Goals reviewed with patient? Yes  SHORT TERM GOALS: Target date: 12/26/2022  Patient will be I with initial HEP in order to progress with  therapy. Baseline: HEP provided at eval Goal status: INITIAL  2.  Patient will be able to ambulate without using SPC in order to reduce functional limitations and improve community access Baseline: patient ambulates with SPC due to pain Goal status: INITIAL  3.  Patient will report pain </= 3/10 with walking and activity in order to improve ability to perform household tasks without limitations Baseline: 5-6/10 pain at eval Goal status: INITIAL  LONG TERM GOALS: Target date: 01/23/2023  Patient will be I with final HEP to maintain progress from PT. Baseline: HEP provided at eval Goal status: INITIAL  2.  Patient will report modified ODI </= 20% disability in order to indicate an improvement in functional ability Baseline: 40% functional disability Goal status: INITIAL  3.  Patient will demonstrate hip strength grossly >/= 4+/5 MMT in order to improve walking ability and decrease pain with stairs Baseline: hip strength grossly 4-/5 MMT Goal status: INITIAL  4.  Patient will demonstrate double limb lowering test to approximately 30 deg with proper control and without increased pain or feeling of instability in order to indicate improve core and spinal control and reduce pain with activity Baseline:  Goal status: INITIAL   PLAN: PT FREQUENCY: 1-2x/week  PT DURATION: 8 weeks  PLANNED INTERVENTIONS: Therapeutic exercises, Therapeutic activity, Neuromuscular re-education, Balance training, Gait training, Patient/Family education, Self Care, Joint mobilization, Aquatic Therapy, Dry Needling, Electrical stimulation, Cryotherapy, Moist heat, Taping, Ionotophoresis '4mg'$ /ml Dexamethasone, Manual therapy, and Re-evaluation.  PLAN FOR NEXT SESSION: Right SI belt over mobilization belt for lower back support .  review HEP and progress PRN, continue assessment for hypermobility as needed, focus on core stabilization and hip strengthening/control, pilates?  Raeford Razor, PT 12/26/22 12:20  PM Phone: [phone] Fax: [phone]

## 2022-12-26 ENCOUNTER — Ambulatory Visit
Payer: Medicaid Other | Attending: Student in an Organized Health Care Education/Training Program | Admitting: Physical Therapy

## 2022-12-26 ENCOUNTER — Encounter: Payer: Self-pay | Admitting: Physical Therapy

## 2022-12-26 DIAGNOSIS — M25552 Pain in left hip: Secondary | ICD-10-CM | POA: Diagnosis not present

## 2022-12-26 DIAGNOSIS — M5459 Other low back pain: Secondary | ICD-10-CM

## 2022-12-26 DIAGNOSIS — M6281 Muscle weakness (generalized): Secondary | ICD-10-CM | POA: Diagnosis not present

## 2022-12-26 DIAGNOSIS — M25551 Pain in right hip: Secondary | ICD-10-CM

## 2022-12-26 DIAGNOSIS — M357 Hypermobility syndrome: Secondary | ICD-10-CM

## 2023-01-01 ENCOUNTER — Ambulatory Visit: Payer: Medicaid Other | Admitting: Physical Therapy

## 2023-01-01 DIAGNOSIS — M25552 Pain in left hip: Secondary | ICD-10-CM

## 2023-01-01 DIAGNOSIS — M5459 Other low back pain: Secondary | ICD-10-CM

## 2023-01-01 DIAGNOSIS — M25551 Pain in right hip: Secondary | ICD-10-CM

## 2023-01-01 DIAGNOSIS — M357 Hypermobility syndrome: Secondary | ICD-10-CM

## 2023-01-01 DIAGNOSIS — M6281 Muscle weakness (generalized): Secondary | ICD-10-CM | POA: Diagnosis not present

## 2023-01-01 NOTE — Therapy (Addendum)
OUTPATIENT PHYSICAL THERAPY TREATMENT DISCHARGE   Patient Name: Anita Barrett MRN: [id number] DOB:[birth year], 22 y.o., adult Today's Date: 01/01/2023   END OF SESSION:  PT End of Session - 01/01/23 0804     Visit Number 5    Number of Visits 13    Date for PT Re-Evaluation 01/23/23    Authorization Type [employer]    PT Start Time 0800    PT Stop Time 0845    PT Time Calculation (min) 45 min    Activity Tolerance Patient tolerated treatment well    Behavior During Therapy [facility] for tasks assessed/performed               No past medical history on file. No past surgical history on file. Patient Active Problem List   Diagnosis Date Noted   Chronic lower back pain 11/07/2022   Depression 11/07/2022   Healthcare maintenance 11/07/2022    PCP: Adron Bene, MD  REFERRING PROVIDER: Tyson Alias, MD  REFERRING DIAG: Chronic bilateral low back pain, unspecified whether sciatica present  Rationale for Evaluation and Treatment: Rehabilitation  THERAPY DIAG:  Other low back pain  Pain in left hip  Pain in right hip  Muscle weakness (generalized)  Hypermobility syndrome  ONSET DATE: ongoing for many years, no specific onset day   SUBJECTIVE SUBJECTIVE STATEMENT:  No new complaints.  Rt hip has been popping a bit more lately.  Back is 3/10, not bad.  Did not have increased pain after last session.     PERTINENT HISTORY:  Depression Hypermobility   PAIN:  Are you having pain? Yes:  NPRS scale: 3/10 (on average can be 3-4/10 but currently worse due to periods) Pain location: Low back, bilateral SI and hips Pain description: Deep, constant, aching, pressure Aggravating factors: Stress, spinal movement or bending, walking up stairs or incline changes, when on periods Relieving factors: Tylenol, creams, CBD products  PRECAUTIONS: None  WEIGHT BEARING RESTRICTIONS: No  FALLS:  Has patient fallen in last 6 months? Yes. Number of falls 1  - reported due to orthostatic hypotension per PCP  PLOF: Independent  PATIENT GOALS: Get rid of pain, improve walking   OBJECTIVE:  PATIENT SURVEYS:  Modified Oswestry 40% disability (20/50)   SCREENING FOR RED FLAGS: Negative  COGNITION: Overall cognitive status: Within functional limits for tasks assessed     SENSATION: WFL  MUSCLE LENGTH: Patient demonstrates a high score on Beighton scale  POSTURE:   Patient tends to externally rotate bilateral hips excessively at rest  PALPATION: Tender to palpation bilateral PSIS and SIJ region  LUMBAR ROM:   Lumbar AROM grossly WFL / excessive mobility  LOWER EXTREMITY ROM:    Patient demonstrates excessive hip ER  LOWER EXTREMITY MMT:    MMT Right eval Left eval  Hip flexion 4 4  Hip extension 4- 4-  Hip abduction 4- 4-  Knee flexion 5 5  Knee extension 5 5   (Blank rows = not tested)  LUMBAR SPECIAL TESTS:  Radicular testing negative Hypermobility with CPAs   FUNCTIONAL TESTS:  Patient reports low back and hip pain, and feeling of instability with table top hold  Patient able to perform double limb lower test to approximately 60 deg prior to onset of low back pain and loss of lumbar control  GAIT: Assistive device utilized: Single point cane Level of assistance: Modified independence Comments: patient using SPC, states will switch hands based on which hip/low back is hurting   TODAY'S TREATMENT:  [facility] Adult PT Treatment:                                                DATE: 01/01/23 Therapeutic Exercise: Recumbent bike L 2 for 5 min  Lumbar stability with ball under pelvis:  90/90 hold  90/90 toe taps  90/90 knee extension  Sit to stand 2 x 10 with and without weight (10 lbs)  Squat x 10 ( 10 lbs)  Dead lift 10 lbs x 10  Single leg hinge with and without UE assist  SLS on foam static to min dynamic  Self Care: HR, dysautonomia and RHR    [facility] Adult PT Treatment:                                                 DATE: 12/26/22 Therapeutic Exercise: Pilates Reformer used for LE/core strength, postural strength, lumbopelvic disassociation and core control.  Exercises included: Footwork 2 red 1 blue parallel and turnout on heels Double and single leg.  Needed towel under pelvis to increase stability, cues needed   Supine Arm work 1 red 1 yellow arcs  Feet in Straps 1 red and 1 yellow arcs , circle and frog squats  Quadruped 1 red on forearms  Knees back  Plank press  Seated Arms /Abs 1 red facing back  Hinge  Roll down  Self care: Neutral spine, core, Pilates and SI belt  [facility] Adult PT Treatment:                                                DATE: 12/19/22 Therapeutic Exercise: Tr A x 10 used ball under pelvis for stability exercises Double leg clam x 10  Single leg alt x 10  Supine march x 10 alt  Supine 90/90 hold then toe tap Banded toe taps from 90/90 Bridging with blue band x 10  Supine double leg clam then single leg blue band Sidelying clam no band  Quadruped Tr A x 5 added LE extension x 5 then donkey lift x 10 each LE (hip ext/knee flex) Manual Therapy: Sidelying manual Lt L5 -S1 soft tissue work and gentle tractioning to L pelvis to elongate  Self care:  body braid, bracing options    [facility] Adult PT Treatment:                                                DATE: 12/12/22 Therapeutic Exercise: Lumbar stabilization TrA  Tr A with march and bent knee fall out, used ball under hips  Sidelying clam pillow between knees x 20 no band  Quadruped TrA  Quadruped Arm lift  Bird dog x 5 Wall squat  Wall sit 30 sec  Springboard shoulder extension x 10 Sidefacing shoulder adduction x 6 each   Bridging x 5 articulation then flat back x 5  Self Care: Neutral spine    [facility] Adult PT Treatment:  DATE: 11/28/2022 Therapeutic Exercise: Posterior pelvic tilt holding for 2 breathes x 5 Posterior pelvic tilt with alternating  march x 5 Side clamshell with red x 10 each  PATIENT EDUCATION:  Education details: Exam findings, POC, HEP, follow-up with PCP about referral for hypermobility syndrome Person educated: Patient Education method: Explanation, Demonstration, Tactile cues, Verbal cues, and Handouts Education comprehension: verbalized understanding, returned demonstration, verbal cues required, tactile cues required, and needs further education  HOME EXERCISE PROGRAM: Access Code: [email or web address] URL: [email or web address] Date: 12/12/2022 Prepared by: Karie Mainland  Exercises - Supine Transversus Abdominis Bracing - Hands on Stomach  - 1 x daily - 7 x weekly - 1 sets - 10 reps - 5 hold - Supine Transversus Abdominis Bracing with Leg Extension  - 1 x daily - 7 x weekly - 2 sets - 10 reps - Supine Transversus Abdominis Bracing with Double Leg Fallout  - 1 x daily - 7 x weekly - 2 sets - 10 reps - Quadruped Transversus Abdominis Bracing  - 1 x daily - 7 x weekly - 1 sets - 5 reps - 5-10 hold - Quadruped on Forearms Hip Extension  - 1 x daily - 7 x weekly - 2 sets - 10 reps - 5 hold - Clam with Resistance  - 1 x daily - 2 sets - 10 reps -Bridge with band  -SLS   ASSESSMENT: CLINICAL IMPRESSION: Patient complains of minimal back pain which did not interfere with exercises performed in the clinic.  May need minimal cueing for hip hinging dead lifting and stability in single-leg stance exercises.  HR response was appropriate.  Tends to hyperextend knees standing.  Cont POC for stability and strength.      OBJECTIVE IMPAIRMENTS: decreased activity tolerance, decreased strength, improper body mechanics, and pain.   ACTIVITY LIMITATIONS: carrying, lifting, bending, sitting, standing, squatting, sleeping, stairs, and locomotion level  PARTICIPATION LIMITATIONS: meal prep, cleaning, laundry, driving, shopping, and community activity  PERSONAL FACTORS: Fitness, Past/current experiences, Social background,  and Time since onset of injury/illness/exacerbation are also affecting patient's functional outcome.   REHAB POTENTIAL: Good  CLINICAL DECISION MAKING: Stable/uncomplicated  EVALUATION COMPLEXITY: Low   GOALS: Goals reviewed with patient? Yes  SHORT TERM GOALS: Target date: 12/26/2022  Patient will be I with initial HEP in order to progress with therapy. Baseline: HEP provided at eval Goal status: INITIAL  2.  Patient will be able to ambulate without using SPC in order to reduce functional limitations and improve community access Baseline: patient ambulates with SPC due to pain Goal status: INITIAL  3.  Patient will report pain </= 3/10 with walking and activity in order to improve ability to perform household tasks without limitations Baseline: 5-6/10 pain at eval Goal status: INITIAL  LONG TERM GOALS: Target date: 01/23/2023  Patient will be I with final HEP to maintain progress from PT. Baseline: HEP provided at eval Goal status: INITIAL  2.  Patient will report modified ODI </= 20% disability in order to indicate an improvement in functional ability Baseline: 40% functional disability Goal status: INITIAL  3.  Patient will demonstrate hip strength grossly >/= 4+/5 MMT in order to improve walking ability and decrease pain with stairs Baseline: hip strength grossly 4-/5 MMT Goal status: INITIAL  4.  Patient will demonstrate double limb lowering test to approximately 30 deg with proper control and without increased pain or feeling of instability in order to indicate improve core and spinal control and reduce pain with activity Baseline:  Goal status:  INITIAL   PLAN: PT FREQUENCY: 1-2x/week  PT DURATION: 8 weeks  PLANNED INTERVENTIONS: Therapeutic exercises, Therapeutic activity, Neuromuscular re-education, Balance training, Gait training, Patient/Family education, Self Care, Joint mobilization, Aquatic Therapy, Dry Needling, Electrical stimulation, Cryotherapy, Moist  heat, Taping, Ionotophoresis 4mg /ml Dexamethasone, Manual therapy, and Re-evaluation.  PLAN FOR NEXT SESSION: Right SI belt over mobilization belt for lower back support .  review HEP and progress PRN, continue assessment for hypermobility as needed, focus on core stabilization and hip strengthening/control, pilates?  Karie Mainland, PT 01/01/23 9:13 AM Phone: [phone] Fax: [phone]   PHYSICAL THERAPY DISCHARGE SUMMARY  Visits from Start of Care: 5  Current functional level related to goals / functional outcomes: See above for most recent info    Remaining deficits: unknown   Education / Equipment: HEP, core, posture, joint mobility    Patient agrees to discharge. Patient goals were not met. Patient is being discharged due to not returning since the last visit.  Patient did not return or reschedule appts.   Karie Mainland, PT 04/01/23 10:45 AM Phone: [phone] Fax: [phone]

## 2023-01-02 ENCOUNTER — Ambulatory Visit: Payer: Medicaid Other | Admitting: Physical Therapy

## 2023-01-04 DIAGNOSIS — F332 Major depressive disorder, recurrent severe without psychotic features: Secondary | ICD-10-CM | POA: Diagnosis not present

## 2023-01-11 DIAGNOSIS — F4310 Post-traumatic stress disorder, unspecified: Secondary | ICD-10-CM | POA: Diagnosis not present

## 2023-01-25 DIAGNOSIS — F332 Major depressive disorder, recurrent severe without psychotic features: Secondary | ICD-10-CM | POA: Diagnosis not present

## 2023-02-01 DIAGNOSIS — F332 Major depressive disorder, recurrent severe without psychotic features: Secondary | ICD-10-CM | POA: Diagnosis not present

## 2023-02-08 DIAGNOSIS — F4310 Post-traumatic stress disorder, unspecified: Secondary | ICD-10-CM | POA: Diagnosis not present

## 2023-02-15 DIAGNOSIS — F4310 Post-traumatic stress disorder, unspecified: Secondary | ICD-10-CM | POA: Diagnosis not present

## 2023-02-25 DIAGNOSIS — F332 Major depressive disorder, recurrent severe without psychotic features: Secondary | ICD-10-CM | POA: Diagnosis not present

## 2023-03-08 DIAGNOSIS — F4310 Post-traumatic stress disorder, unspecified: Secondary | ICD-10-CM | POA: Diagnosis not present

## 2023-03-11 ENCOUNTER — Other Ambulatory Visit (HOSPITAL_COMMUNITY): Payer: Self-pay | Admitting: Student

## 2023-03-11 ENCOUNTER — Ambulatory Visit: Payer: Medicaid Other | Admitting: Student

## 2023-03-11 ENCOUNTER — Other Ambulatory Visit: Payer: Self-pay | Admitting: Student

## 2023-03-11 ENCOUNTER — Encounter: Payer: Self-pay | Admitting: Student

## 2023-03-11 ENCOUNTER — Ambulatory Visit (HOSPITAL_COMMUNITY)
Admission: RE | Admit: 2023-03-11 | Discharge: 2023-03-11 | Disposition: A | Discharge status: Home / Self Care | Payer: Medicaid Other | Source: Ambulatory Visit | Attending: Internal Medicine | Admitting: Internal Medicine

## 2023-03-11 VITALS — BP 111/63 | HR 68 | Temp 98.3°F | Ht 66.0 in | Wt 156.5 lb

## 2023-03-11 DIAGNOSIS — R52 Pain, unspecified: Secondary | ICD-10-CM

## 2023-03-11 DIAGNOSIS — G8929 Other chronic pain: Secondary | ICD-10-CM | POA: Diagnosis not present

## 2023-03-11 DIAGNOSIS — M5441 Lumbago with sciatica, right side: Secondary | ICD-10-CM | POA: Diagnosis not present

## 2023-03-11 DIAGNOSIS — M5442 Lumbago with sciatica, left side: Secondary | ICD-10-CM | POA: Diagnosis not present

## 2023-03-11 DIAGNOSIS — M1612 Unilateral primary osteoarthritis, left hip: Secondary | ICD-10-CM | POA: Diagnosis not present

## 2023-03-11 DIAGNOSIS — Z0000 Encounter for general adult medical examination without abnormal findings: Secondary | ICD-10-CM

## 2023-03-11 DIAGNOSIS — M1611 Unilateral primary osteoarthritis, right hip: Secondary | ICD-10-CM | POA: Diagnosis not present

## 2023-03-11 NOTE — Assessment & Plan Note (Signed)
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 In the past takes tylenol and alsways has pain   Went to PT once a week stopped 1 month ago pt is out of town  Some improvement  Notes hypermobile Stiffness in morning worse with standing  At [employer]  More movement makes it worse    Tylenol  500mg   Ibuprofen 4-5 times a week 400 mg

## 2023-03-11 NOTE — Patient Instructions (Signed)
Is a pleasure seeing you in clinic today We will obtain imaging of your hips to check for ankylosing spondylitis due to the stiffness you have.  Please continue Tylenol as needed you can take this every 8 hours or so. If additional pain relief is needed would recommend trying naproxen 250 mg every 12 hours, this may be a little longer lasting than ibuprofen.  Please not take naproxen and ibuprofen together.  I will look into EDS more and see if we recommend further testing for this.  Follow-up in 1 month for Pap smear and menstrual pain.

## 2023-03-12 NOTE — Progress Notes (Signed)
Established Patient Office Visit  Subjective   Patient ID: Anita Barrett, adult    DOB: Jul 06, 2001  Age: 22 y.o. MRN: [id number]  Chief Complaint  Patient presents with   Back Pain    Having constant lower back pain, requesting a anti inflammatory   Depression    Refused PHQ9 today    Anita Barrett is a 22 y.o. person living with a history listed below who presents to clinic for follow up of back pain. Please refer to problem based charting for further details and assessment and plan of current problem and chronic medical conditions.     Patient Active Problem List   Diagnosis Date Noted   Chronic lower back pain 11/07/2022   Depression 11/07/2022   Healthcare maintenance 11/07/2022      ROS: negative as per HPI     Objective:     BP 111/63 (BP Location: Left Arm, Patient Position: Sitting, Cuff Size: Normal)   Pulse 68   Temp 98.3 F (36.8 C) (Oral)   Ht 5\' 6"  (1.676 m)   Wt 156 lb 8 oz (71 kg)   LMP 02/19/2023 (Exact Date)   SpO2 99%   BMI 25.26 kg/m  BP Readings from Last 3 Encounters:  03/11/23 111/63  11/07/22 123/72  03/22/22 140/80      Physical Exam Constitutional:      Appearance: Normal appearance.     Comments: Sitting in chair with no acute distress  HENT:     Mouth/Throat:     Mouth: Mucous membranes are moist.     Pharynx: Oropharynx is clear.  Cardiovascular:     Rate and Rhythm: Normal rate and regular rhythm.     Pulses: Normal pulses.     Heart sounds: No murmur heard.    No friction rub. No gallop.  Pulmonary:     Effort: Pulmonary effort is normal.     Breath sounds: No rhonchi or rales.  Abdominal:     General: Abdomen is flat. Bowel sounds are normal. There is no distension.     Palpations: Abdomen is soft.     Tenderness: There is no abdominal tenderness.  Musculoskeletal:     Right lower leg: No edema.     Left lower leg: No edema.     Comments: Mild TTP of the bilateral lumbar paraspinal muscles, mild tenderness of  the SI joints b/l with lateral compression. Increase passive dorsiflexion of bilateral fifth fingers, passive position of the bilateral thumbs, hyperextension of the elbows greater than 10 degrees, flexion at the waist with palms on the knees 4 weeks  Skin:    General: Skin is warm and dry.     Capillary Refill: Capillary refill takes less than 2 seconds.     Comments: No obvious scarring or skin abnormalities  Neurological:     General: No focal deficit present.     Mental Status: He is 22 alert and oriented to person, place, and time.  Psychiatric:        Mood and Affect: Mood normal.        Behavior: Behavior normal.      No results found for any visits on 03/11/23.     The ASCVD Risk score (Arnett DK, et al., 2019) failed to calculate for the following reasons:   The 2019 ASCVD risk score is only valid for ages 45 to 103    Assessment & Plan:   Problem List Items Addressed This Visit     Chronic lower back  pain - Primary    History chronic hip and back pain from a young age.  Does not currently have sciatic pain but has had bilateral sciatic type symptoms in the past.  Describes stiffness in the sacrum, and worse with activity.  Uses extra strength Tylenol and Aleve with some improvement.  Patient has had has had 4 PT sessions.  Does feel like this is improved her functional status.  Has not gone in the last month due to physical therapist being out of town.  Does note that physical therapist was concerned about hypermobility.  She does report increased mobility has been notable since childhood.  On exam she has minimal tenderness at the sacroiliac joints and around sacrum and mild bilateral paraspinal tenderness in the lumbar area. Beighton hypermobility scale with score of 7 with increase passive dorsiflexion of the fifth finger, passive apposition fo the thumb, hyperextension of the elbow, and flexion at the waist. given persistent pain despite PT and symptoms of SI stiffness will  order to evaluate SI joints for inflammatory etiology.  Given hypermobility and early joint pain in this patient EDS or other connective tissue disease may be a possibility.  No definite family history of this.  No obvious abnormalities on skin exam  Continue Tylenol, Aleve 250 mg twice daily as needed for pain continue physical therapy Will obtain hip and pelvic imaging to evaluate for axial spondylarthritis If imaging is negative may benefit from further workup for EDS,  would need a more thorough skin exam for hyperextensibility and skin trauma and possible genetic testing      Healthcare maintenance    She would like to have a Pap smear at next visit, no prior Pap smears        Return in about 4 weeks (around 04/08/2023) for Pap smear, dysmenorrhea.    Quincy Simmonds, MD

## 2023-03-12 NOTE — Assessment & Plan Note (Signed)
She would like to have a Pap smear at next visit, no prior Pap smears

## 2023-03-15 DIAGNOSIS — F4310 Post-traumatic stress disorder, unspecified: Secondary | ICD-10-CM | POA: Diagnosis not present

## 2023-03-15 NOTE — Progress Notes (Signed)
Internal Medicine Clinic Attending ? ?Case discussed with Dr. Liang  At the time of the visit.  We reviewed the resident?s history and exam and pertinent patient test results.  I agree with the assessment, diagnosis, and plan of care documented in the resident?s note. ? ?

## 2023-03-29 DIAGNOSIS — F4310 Post-traumatic stress disorder, unspecified: Secondary | ICD-10-CM | POA: Diagnosis not present

## 2023-04-05 DIAGNOSIS — F332 Major depressive disorder, recurrent severe without psychotic features: Secondary | ICD-10-CM | POA: Diagnosis not present

## 2023-04-11 ENCOUNTER — Ambulatory Visit: Payer: Medicaid Other | Admitting: Internal Medicine

## 2023-04-11 ENCOUNTER — Encounter: Payer: Self-pay | Admitting: Internal Medicine

## 2023-04-11 ENCOUNTER — Other Ambulatory Visit (HOSPITAL_COMMUNITY): Payer: Self-pay

## 2023-04-11 ENCOUNTER — Other Ambulatory Visit (HOSPITAL_COMMUNITY)
Admission: RE | Admit: 2023-04-11 | Discharge: 2023-04-11 | Disposition: A | Discharge status: Home / Self Care | Payer: Medicaid Other | Source: Ambulatory Visit | Attending: Internal Medicine | Admitting: Internal Medicine

## 2023-04-11 VITALS — BP 108/61 | HR 77 | Temp 98.3°F | Ht 66.0 in | Wt 158.4 lb

## 2023-04-11 DIAGNOSIS — Z124 Encounter for screening for malignant neoplasm of cervix: Secondary | ICD-10-CM | POA: Diagnosis not present

## 2023-04-11 DIAGNOSIS — N946 Dysmenorrhea, unspecified: Secondary | ICD-10-CM

## 2023-04-11 DIAGNOSIS — N920 Excessive and frequent menstruation with regular cycle: Secondary | ICD-10-CM | POA: Diagnosis not present

## 2023-04-11 DIAGNOSIS — Z0000 Encounter for general adult medical examination without abnormal findings: Secondary | ICD-10-CM

## 2023-04-11 MED ORDER — LO LOESTRIN FE 1 MG-10 MCG / 10 MCG PO TABS
1.0000 | ORAL_TABLET | Freq: Every day | ORAL | 11 refills | Status: DC
  Filled 2023-04-11: qty 28, 28d supply, fill #0

## 2023-04-11 NOTE — Patient Instructions (Addendum)
Thank you, [name] for allowing [place] to provide your care today  I will call with results and recommendation on when to repeat.   I am checking blood work today to make sure your blood counts are ok. I recommend that you start birth control medications to see if this helps with period symptoms. I sent medication to [facility].  I have ordered the following labs for you:  Lab Orders         CBC no Diff       I have ordered the following medication/changed the following medications:   Stop the following medications: There are no discontinued medications.   Start the following medications: Meds ordered this encounter  Medications   Norethindrone-Ethinyl Estradiol-Fe Biphas (LO LOESTRIN FE) 1 MG-10 MCG / 10 MCG tablet    Sig: Take 1 tablet by mouth daily.    Dispense:  28 tablet    Refill:  11     Follow up: 1 month  We look forward to seeing you next time. Please call our clinic at [phone] if you have any questions or concerns. The best time to call is Monday-Friday from 9am-4pm, but there is someone available 24/7. If after hours or the weekend, call the main hospital number and ask for the Internal Medicine Resident On-Call. If you need medication refills, please notify your pharmacy one week in advance and they will send [place] a request.   Thank you for trusting me with your care. Wishing you the best!   Rudene Christians, DO [facility]

## 2023-04-12 DIAGNOSIS — N946 Dysmenorrhea, unspecified: Secondary | ICD-10-CM | POA: Insufficient documentation

## 2023-04-12 DIAGNOSIS — F4310 Post-traumatic stress disorder, unspecified: Secondary | ICD-10-CM | POA: Diagnosis not present

## 2023-04-12 LAB — CYTOLOGY - PAP
Adequacy: ABSENT
Diagnosis: NEGATIVE

## 2023-04-12 LAB — CBC
Hematocrit: 35.1 % (ref 34.0–46.6)
Hemoglobin: 11.5 g/dL (ref 11.1–15.9)
MCH: 24.8 pg — ABNORMAL LOW (ref 26.6–33.0)
MCHC: 32.8 g/dL (ref 31.5–35.7)
MCV: 76 fL — ABNORMAL LOW (ref 79–97)
Platelets: 197 10*3/uL (ref 150–450)
RBC: 4.64 x10E6/uL (ref 3.77–5.28)
RDW: 14.5 % (ref 11.7–15.4)
WBC: 8.1 10*3/uL (ref 3.4–10.8)

## 2023-04-12 LAB — CERVICOVAGINAL ANCILLARY ONLY
Chlamydia: NEGATIVE
Comment: NEGATIVE
Comment: NORMAL
Neisseria Gonorrhea: NEGATIVE

## 2023-04-12 NOTE — Assessment & Plan Note (Addendum)
History of dysmenorrhea and menorrhagia since onset of periods. He reports that periods are now regular but he typically goes through super tampon in 1 hour for several days. He endorses fatigue and was told he had anemia in childhood.  A: First line treatment for menorrhagia and dysmenorrhea would be with OCP or IUD. He does not want systemic estrogen and has some reservations about IUD placement due to pain. P: CBC showed Hgb of 11.5.  Could consider referral to OBGYN to discuss option for smaller IUD if Norina Buzzard is interested. I will call them prior to making referral.  Addendum[facility] [place] and [facility] do not need a referral and accept patient's insurance. I called [facility] to let thme know they could go there if interested in IUD placement to help with symptoms.

## 2023-04-12 NOTE — Assessment & Plan Note (Signed)
Pap smear completed. -G/C screening -cytology  Addendum: G/C screening negative Cytology was normal but transformation zone was absent. Will have them come back within 12 months to repeat cytology.

## 2023-04-12 NOTE — Progress Notes (Signed)
Subjective:  CC: cervical cancer screening  HPI:  [name] is a 22 y.o. nonbinary with a past medical history stated below and presents today for cervical cancer screening. Please see problem based assessment and plan for additional details.  Current Outpatient Medications on File Prior to Visit  Medication Sig Dispense Refill   ibuprofen (ADVIL) 200 MG tablet Take 400 mg by mouth every 6 (six) hours as needed for moderate pain.     No current facility-administered medications on file prior to visit.    Social History   Socioeconomic History   Marital status: Single    Spouse name: Not on file   Number of children: Not on file   Years of education: Not on file   Highest education level: Not on file  Occupational History   Not on file  Tobacco Use   Smoking status: Never   Smokeless tobacco: Never  Vaping Use   Vaping Use: Never used  Substance and Sexual Activity   Alcohol use: Never   Drug use: Yes    Types: Marijuana    Comment: THC   Sexual activity: Not on file  Other Topics Concern   Not on file  Social History Narrative   Not on file   Social Determinants of Health   Financial Resource Strain: Not on file  Food Insecurity: Food Insecurity Present (11/07/2022)   Hunger Vital Sign    Worried About Running Out of Food in the Last Year: Sometimes true    Ran Out of Food in the Last Year: Sometimes true  Transportation Needs: Unmet Transportation Needs (11/07/2022)   PRAPARE - Administrator, Civil Service (Medical): No    Lack of Transportation (Non-Medical): Yes  Physical Activity: Not on file  Stress: Not on file  Social Connections: Moderately Isolated (11/07/2022)   Social Connection and Isolation Panel [NHANES]    Frequency of Communication with Friends and Family: More than three times a week    Frequency of Social Gatherings with Friends and Family: More than three times a week    Attends Religious Services: Never    Automotive engineer or Organizations: No    Attends Banker Meetings: Never    Marital Status: Living with partner  Intimate Partner Violence: Not At Risk (11/07/2022)   Humiliation, Afraid, Rape, and Kick questionnaire    Fear of Current or Ex-Partner: No    Emotionally Abused: No    Physically Abused: No    Sexually Abused: No    Review of Systems: ROS negative except for what is noted on the assessment and plan.  Objective:   Vitals:   04/11/23 1044  BP: 108/61  Pulse: 77  Temp: 98.3 F (36.8 C)  TempSrc: Oral  SpO2: 100%  Weight: 158 lb 6.4 oz (71.8 kg)  Height: 5\' 6"  (1.676 m)    Physical Exam: Constitutional: well-appearing  Cardiovascular: regular rate and rhythm, no m/r/g Pulmonary/Chest: normal work of breathing on room air, lungs clear to auscultation bilaterally GU: normal appearing external gentilia, normal appearing vaginal mucosa and cervix.  white discharge noted in vaginal vault, no blood.  Pap smear and wet prep obtained.  Nursing staff present during patient's pelvic examination  Assessment & Plan:  Healthcare maintenance Pap smear completed. -G/C screening -cytology  Dysmenorrhea History of dysmenorrhea and menorrhagia since onset of periods. He reports that periods are now regular but he typically goes through super tampon in 1 hour for several days.  He endorses fatigue and was told he had anemia in childhood.  A: First line treatment for menorrhagia and dysmenorrhea would be with OCP or IUD. He does not want systemic estrogen and has some reservations about IUD placement due to pain. P: CBC showed Hgb of 11.5.  Could consider referral to OBGYN to discuss option for smaller IUD if Anita Barrett is interested. I will call them prior to making referral.    Patient discussed with Dr. Percell Boston Makenly Larabee, D.O. [facility]  PGY-2 Pager: [phone]  Phone: [phone] Date 04/12/2023  Time 9:30 AM

## 2023-04-16 NOTE — Progress Notes (Signed)
Internal Medicine Clinic Attending  Case discussed with Dr. Masters  at the time of the visit.  We reviewed the resident's history and exam and pertinent patient test results.  I agree with the assessment, diagnosis, and plan of care documented in the resident's note.  

## 2023-04-26 DIAGNOSIS — F4310 Post-traumatic stress disorder, unspecified: Secondary | ICD-10-CM | POA: Diagnosis not present

## 2023-05-03 DIAGNOSIS — F4310 Post-traumatic stress disorder, unspecified: Secondary | ICD-10-CM | POA: Diagnosis not present

## 2023-05-10 DIAGNOSIS — F4310 Post-traumatic stress disorder, unspecified: Secondary | ICD-10-CM | POA: Diagnosis not present

## 2023-05-17 DIAGNOSIS — F332 Major depressive disorder, recurrent severe without psychotic features: Secondary | ICD-10-CM | POA: Diagnosis not present

## 2023-05-31 DIAGNOSIS — F4310 Post-traumatic stress disorder, unspecified: Secondary | ICD-10-CM | POA: Diagnosis not present

## 2023-06-07 DIAGNOSIS — F332 Major depressive disorder, recurrent severe without psychotic features: Secondary | ICD-10-CM | POA: Diagnosis not present

## 2023-06-21 DIAGNOSIS — F332 Major depressive disorder, recurrent severe without psychotic features: Secondary | ICD-10-CM | POA: Diagnosis not present

## 2023-06-28 DIAGNOSIS — F332 Major depressive disorder, recurrent severe without psychotic features: Secondary | ICD-10-CM | POA: Diagnosis not present

## 2023-07-01 DIAGNOSIS — F4310 Post-traumatic stress disorder, unspecified: Secondary | ICD-10-CM | POA: Diagnosis not present

## 2023-07-05 DIAGNOSIS — F4310 Post-traumatic stress disorder, unspecified: Secondary | ICD-10-CM | POA: Diagnosis not present

## 2023-07-19 DIAGNOSIS — F4481 Dissociative identity disorder: Secondary | ICD-10-CM | POA: Diagnosis not present

## 2023-07-26 DIAGNOSIS — F4481 Dissociative identity disorder: Secondary | ICD-10-CM | POA: Diagnosis not present

## 2023-08-09 DIAGNOSIS — F4481 Dissociative identity disorder: Secondary | ICD-10-CM | POA: Diagnosis not present

## 2023-08-16 DIAGNOSIS — F4481 Dissociative identity disorder: Secondary | ICD-10-CM | POA: Diagnosis not present

## 2023-08-30 DIAGNOSIS — F4481 Dissociative identity disorder: Secondary | ICD-10-CM | POA: Diagnosis not present

## 2023-09-06 DIAGNOSIS — F4481 Dissociative identity disorder: Secondary | ICD-10-CM | POA: Diagnosis not present

## 2023-09-13 DIAGNOSIS — F4481 Dissociative identity disorder: Secondary | ICD-10-CM | POA: Diagnosis not present

## 2023-09-18 DIAGNOSIS — F4481 Dissociative identity disorder: Secondary | ICD-10-CM | POA: Diagnosis not present

## 2023-10-02 DIAGNOSIS — F4481 Dissociative identity disorder: Secondary | ICD-10-CM | POA: Diagnosis not present

## 2023-10-10 DIAGNOSIS — F4481 Dissociative identity disorder: Secondary | ICD-10-CM | POA: Diagnosis not present

## 2023-10-18 DIAGNOSIS — F4481 Dissociative identity disorder: Secondary | ICD-10-CM | POA: Diagnosis not present

## 2023-10-25 DIAGNOSIS — F4481 Dissociative identity disorder: Secondary | ICD-10-CM | POA: Diagnosis not present

## 2023-11-08 DIAGNOSIS — F4481 Dissociative identity disorder: Secondary | ICD-10-CM | POA: Diagnosis not present

## 2023-11-18 DIAGNOSIS — F4310 Post-traumatic stress disorder, unspecified: Secondary | ICD-10-CM | POA: Diagnosis not present

## 2024-07-21 ENCOUNTER — Ambulatory Visit: Admitting: Physician Assistant

## 2024-07-21 ENCOUNTER — Ambulatory Visit: Payer: Self-pay

## 2024-07-21 VITALS — BP 125/71 | HR 77 | Wt 156.6 lb

## 2024-07-21 DIAGNOSIS — H60392 Other infective otitis externa, left ear: Secondary | ICD-10-CM | POA: Insufficient documentation

## 2024-07-21 DIAGNOSIS — H6690 Otitis media, unspecified, unspecified ear: Secondary | ICD-10-CM

## 2024-07-21 MED ORDER — AMOXICILLIN-POT CLAVULANATE 875-125 MG PO TABS: 1.0000 | ORAL_TABLET | Freq: Two times a day (BID) | ORAL | 0 refills | Status: AC

## 2024-07-21 NOTE — Telephone Encounter (Signed)
 FYI Only or Action Required?: FYI only for provider.  Patient was last seen in primary care on 04/11/2023 by Kenn Pagan, DO.  Called Nurse Triage reporting Ear Pain.  Symptoms began a week ago.  Interventions attempted: OTC medications: swimmers ear drops and Rest, hydration, or home remedies.  Symptoms are: gradually worsening.  Triage Disposition: See Physician Within 24 Hours  Patient/caregiver understands and will follow disposition?: Yes Copied from CRM #[id number]. Topic: Clinical - Red Word Triage >> Jul 21, 2024  9:27 AM Diannia H wrote: Red Word that prompted transfer to Nurse Triage: Patient went to beach and felt fine but last Tuesday she had some ear pain, then on Friday the pain really increased in her ear. She has pain in her jaw as well. On a pain scale of 1-10 pain level is a 7 when she is eating other than that its really no pain. This has been going on for a week. Hers ears were making like a popping nose. Reason for Disposition  Earache  (Exceptions: Brief ear pain of lasting less than 60 minutes, or earache occurring during air travel.)  Answer Assessment - Initial Assessment Questions Additional info: No acute appointments are available in office, provided with [facility]. Patient plans on [facility].    1. LOCATION: Which ear is involved?     left 2. ONSET: When did the ear pain start?      Tuesday, worse on Friday  3. SEVERITY: How bad is the pain?  (Scale 1-10; mild, moderate or severe)     7/10 popping noise 4. URI SYMPTOMS: Do you have a runny nose or cough?     Denies 5. FEVER: Do you have a fever? If Yes, ask: What is your temperature, how was it measured, and when did it start?     Denies 6. CAUSE: Have you been swimming recently?, How often do you use Q-TIPS?, Have you had any recent air travel or scuba diving?     Yes, at beach on Tuesday 7. OTHER SYMPTOMS: Do you  have any other symptoms? (e.g., decreased hearing, dizziness, headache, stiff neck, vomiting)     Left sided jaw pain when eating. Headache on Friday.  Protocols used: Rilla

## 2024-07-21 NOTE — Progress Notes (Unsigned)
 Established Patient Office Visit  Subjective   Patient ID: Anita Barrett, adult    DOB: Sep 25, 2001  Age: 23 y.o. MRN: [id number]  Chief Complaint  Patient presents with   Ear Pain    Pain goes to jaw  Got ear drops  otc Left ear/ had some swelling Friday    Discussed the use of AI scribe software for clinical note transcription with the patient, who gave verbal consent to proceed.  History of Present Illness  History of Present Illness Anita Barrett is a 23 year old who presents with left ear pain radiating to the jaw.  They experience left ear pain radiating to the jaw, which began after a beach trip. The pain worsened when chewing a gummy multivitamin, with discomfort described as deeper than the ear canal. They have hypermobility, leading to flexible joints, including the jaw, and feels ear discomfort when opening the jaw. Swelling on Friday affected their bite alignment. Over-the-counter ear drops provided minimal relief, and they use Tylenol for pain. There is no fever, chills, or ear discharge. They had a headache on Friday, possibly related to starting Prozac. No antibiotics have been taken in the last thirty days.       No past medical history on file. Social History   Socioeconomic History   Marital status: Single    Spouse name: Not on file   Number of children: Not on file   Years of education: Not on file   Highest education level: Not on file  Occupational History   Not on file  Tobacco Use   Smoking status: Never   Smokeless tobacco: Never  Vaping Use   Vaping status: Never Used  Substance and Sexual Activity   Alcohol use: Never   Drug use: Yes    Types: Marijuana    Comment: THC   Sexual activity: Not on file  Other Topics Concern   Not on file  Social History Narrative   Not on file   Social Drivers of Health   Financial Resource Strain: Not on file  Food Insecurity: Food Insecurity Present (11/07/2022)   Hunger Vital Sign     Worried About Running Out of Food in the Last Year: Sometimes true    Ran Out of Food in the Last Year: Sometimes true  Transportation Needs: Unmet Transportation Needs (11/07/2022)   PRAPARE - Administrator, Civil Service (Medical): No    Lack of Transportation (Non-Medical): Yes  Physical Activity: Not on file  Stress: Not on file  Social Connections: Moderately Isolated (11/07/2022)   Social Connection and Isolation Panel    Frequency of Communication with Friends and Family: More than three times a week    Frequency of Social Gatherings with Friends and Family: More than three times a week    Attends Religious Services: Never    Database administrator or Organizations: No    Attends Banker Meetings: Never    Marital Status: Living with partner  Intimate Partner Violence: Not At Risk (11/07/2022)   Humiliation, Afraid, Rape, and Kick questionnaire    Fear of Current or Ex-Partner: No    Emotionally Abused: No    Physically Abused: No    Sexually Abused: No   No family history on file. Allergies  Allergen Reactions   Kiwi Extract Itching    Review of Systems  Constitutional:  Negative for chills and fever.  HENT:  Positive for ear discharge and ear pain. Negative for sinus  pain and sore throat.   Eyes: Negative.   Respiratory:  Negative for shortness of breath.   Cardiovascular:  Negative for chest pain.  Gastrointestinal: Negative.   Genitourinary: Negative.   Musculoskeletal: Negative.   Skin: Negative.   Neurological: Negative.   Endo/Heme/Allergies: Negative.   Psychiatric/Behavioral: Negative.        Objective:     BP 125/71 (BP Location: Left Arm, Patient Position: Sitting, Cuff Size: Normal)   Pulse 77   Wt 156 lb 9.6 oz (71 kg)   LMP 06/26/2024   SpO2 100%   BMI 25.28 kg/m  BP Readings from Last 3 Encounters:  07/21/24 125/71  04/11/23 108/61  03/11/23 111/63   Wt Readings from Last 3 Encounters:  07/21/24 156 lb 9.6 oz (71  kg)  04/11/23 158 lb 6.4 oz (71.8 kg)  03/11/23 156 lb 8 oz (71 kg)    Physical Exam Vitals and nursing note reviewed.  Constitutional:      Appearance: Normal appearance.  HENT:     Head: Normocephalic and atraumatic.     Right Ear: Tympanic membrane is injected, erythematous and bulging.     Left Ear: Tympanic membrane, ear canal and external ear normal.     Nose: Nose normal.     Mouth/Throat:     Mouth: Mucous membranes are moist.     Pharynx: Oropharynx is clear.  Eyes:     Extraocular Movements: Extraocular movements intact.     Conjunctiva/sclera: Conjunctivae normal.     Pupils: Pupils are equal, round, and reactive to light.  Cardiovascular:     Rate and Rhythm: Normal rate and regular rhythm.     Pulses: Normal pulses.     Heart sounds: Normal heart sounds.  Pulmonary:     Effort: Pulmonary effort is normal.     Breath sounds: Normal breath sounds.  Musculoskeletal:        General: Normal range of motion.     Cervical back: Normal range of motion and neck supple.  Lymphadenopathy:     Head:     Left side of head: Preauricular and posterior auricular adenopathy present.  Skin:    General: Skin is warm and dry.  Neurological:     General: No focal deficit present.     Mental Status: He is alert and oriented to person, place, and time.  Psychiatric:        Mood and Affect: Mood normal.        Behavior: Behavior normal.        Thought Content: Thought content normal.        Judgment: Judgment normal.      Assessment & Plan:   Problem List Items Addressed This Visit       Nervous and Auditory   Infective otitis externa of left ear - Primary   Relevant Medications   amoxicillin-clavulanate (AUGMENTIN) 875-125 MG tablet   Assessment and Plan Left ear pain with jaw involvement Left ear pain extending to jaw, likely post-swimming ear infection. No fever, chills, or discharge. Severe pain with headache.  - Prescribed Augmentin 875 mg orally twice daily for  7 days. - Advised taking Augmentin with food to minimize gastrointestinal upset. - Recommended yogurt or probiotics to mitigate gastrointestinal side effects. - Continue Tylenol for pain management. - Suggested heating pad for additional pain relief.  The patient was given clear instructions to go to ER or return to medical center if symptoms don't improve, worsen or new problems develop. The patient  verbalized understanding.    I have reviewed the patient's medical history (PMH, PSH, Social History, Family History, Medications, and allergies) , and have been updated if relevant. I spent 30 minutes reviewing chart and  face to face time with patient.   Return if symptoms worsen or fail to improve.    Kirk RAMAN Mayers, PA-C

## 2024-07-21 NOTE — Telephone Encounter (Signed)
 Available appt this afternoon - called pt , no answer. Left message on vm to  call to office if pt can come .

## 2024-07-23 ENCOUNTER — Telehealth: Admitting: Physician Assistant

## 2024-07-23 ENCOUNTER — Encounter: Payer: Self-pay | Admitting: Physician Assistant

## 2024-07-23 DIAGNOSIS — R6889 Other general symptoms and signs: Secondary | ICD-10-CM

## 2024-07-23 NOTE — Patient Instructions (Signed)
 VISIT SUMMARY:  You came in today because of left ear pain that radiates to your jaw, which started after a beach trip. The pain worsened when chewing and you have some swelling that affected your bite alignment. You have been using over-the-counter ear drops and Tylenol for pain relief.  YOUR PLAN:  -LEFT EAR PAIN WITH JAW INVOLVEMENT: Your left ear pain extending to your jaw is likely due to an ear infection that developed after swimming. An ear infection can cause pain, swelling, and discomfort. You have been prescribed Augmentin 875 mg to take orally twice daily for 7 days to treat the infection. Be sure to take it with food to avoid stomach upset, and consider eating yogurt or taking probiotics to help with any gastrointestinal side effects. Continue using Tylenol for pain and try using a heating pad for additional relief. If you do not feel better after finishing the antibiotics, please come back for further evaluation. Also, watch for signs of a perforated eardrum, such as severe pain, drainage, or hearing loss, and seek care if these occur.  Otitis Media, Adult  Otitis media occurs when there is inflammation and fluid in the middle ear with signs and symptoms of an acute infection. The middle ear is a part of the ear that contains bones for hearing as well as air that helps send sounds to the brain. When infected fluid builds up in this space, it causes pressure and can lead to an ear infection. The eustachian tube connects the middle ear to the back of the nose (nasopharynx) and normally allows air into the middle ear. If the eustachian tube becomes blocked, fluid can build up and become infected. What are the causes? This condition is caused by a blockage in the eustachian tube. This can be caused by mucus or by swelling of the tube. Problems that can cause a blockage include: A cold or other upper respiratory infection. Allergies. An irritant, such as tobacco smoke. Enlarged adenoids. The  adenoids are areas of soft tissue located high in the back of the throat, behind the nose and the roof of the mouth. They are part of the body's defense system (immune system). A mass in the nasopharynx. Damage to the ear caused by pressure changes (barotrauma). What increases the risk? You are more likely to develop this condition if you: Smoke or are exposed to tobacco smoke. Have an opening in the roof of your mouth (cleft palate). Have gastroesophageal reflux. Have an immune system disorder. What are the signs or symptoms? Symptoms of this condition include: Ear pain. Fever. Decreased hearing. Tiredness (lethargy). Fluid leaking from the ear, if the eardrum is ruptured or has burst. Ringing in the ear. How is this diagnosed?  This condition is diagnosed with a physical exam. During the exam, your health care provider will use an instrument called an otoscope to look in your ear and check for redness, swelling, and fluid. He or she will also ask about your symptoms. Your health care provider may also order tests, such as: A pneumatic otoscopy. This is a test to check the movement of the eardrum. It is done by squeezing a small amount of air into the ear. A tympanogram. This is a test that shows how well the eardrum moves in response to air pressure in the ear canal. It provides a graph for your health care provider to review. How is this treated? This condition can go away on its own within 3-5 days. But if the condition is  caused by a bacterial infection and does not go away on its own, or if it keeps coming back, your health care provider may: Prescribe antibiotic medicine to treat the infection. Prescribe or recommend medicines to control pain. Follow these instructions at home: Take over-the-counter and prescription medicines only as told by your health care provider. If you were prescribed an antibiotic medicine, take it as told by your health care provider. Do not stop taking  the antibiotic even if you start to feel better. Keep all follow-up visits. This is important. Contact a health care provider if: You have bleeding from your nose. There is a lump on your neck. You are not feeling better in 5 days. You feel worse instead of better. Get help right away if: You have severe pain that is not controlled with medicine. You have swelling, redness, or pain around your ear. You have stiffness in your neck. A part of your face is not moving (paralyzed). The bone behind your ear (mastoid bone) is tender when you touch it. You develop a severe headache. Summary Otitis media is redness, soreness, and swelling of the middle ear, usually resulting in pain and decreased hearing. This condition can go away on its own within 3-5 days. If the problem does not go away in 3-5 days, your health care provider may give you medicines to treat the infection. If you were prescribed an antibiotic medicine, take it as told by your health care provider. Follow all instructions that were given to you by your health care provider. This information is not intended to replace advice given to you by your health care provider. Make sure you discuss any questions you have with your health care provider. Document Revised: 01/16/2021 Document Reviewed: 01/16/2021 Elsevier Patient Education  2024 [employer].

## 2024-07-23 NOTE — Progress Notes (Signed)
  Because this is something that needs further assessment that what can be offered via an e-visit or virtual urgent care, I feel your condition warrants further evaluation and I recommend that you be seen in a face-to-face visit. I would first recommend reaching out to your PCP or GYN for an assessment.    NOTE: There will be NO CHARGE for this E-Visit   If you are having a true medical emergency, please call 911.     For an urgent face to face visit, [facility] has multiple urgent care centers for your convenience.  Click the link below for the full list of locations and hours, walk-in wait times, appointment scheduling options and driving directions:  Urgent Care - [place], [place], [place], [place], [place], [place]  [place]     Your MyChart E-visit questionnaire answers were reviewed by a board certified advanced clinical practitioner to complete your personal care plan based on your specific symptoms.    Thank you for using e-Visits.

## 2024-08-01 ENCOUNTER — Encounter: Payer: Self-pay | Admitting: Emergency Medicine

## 2024-08-01 ENCOUNTER — Ambulatory Visit: Admission: EM | Admit: 2024-08-01 | Discharge: 2024-08-01 | Disposition: A | Discharge status: Home / Self Care

## 2024-08-01 ENCOUNTER — Telehealth: Admitting: Family Medicine

## 2024-08-01 DIAGNOSIS — H6690 Otitis media, unspecified, unspecified ear: Secondary | ICD-10-CM

## 2024-08-01 DIAGNOSIS — H9209 Otalgia, unspecified ear: Secondary | ICD-10-CM

## 2024-08-01 MED ORDER — AMOXICILLIN-POT CLAVULANATE 875-125 MG PO TABS
1.0000 | ORAL_TABLET | Freq: Two times a day (BID) | ORAL | 0 refills | Status: AC
Start: 2024-08-01 — End: 2024-08-04

## 2024-08-01 NOTE — ED Triage Notes (Signed)
 Pt c/o left ear pain for 3 weeks. States they went to beach 3 weeks ago and ear pain started after that. They went to [facility] about 2 weeks ago and were given abx for ear infection. Presents today due to still have mild deep ear/ jaw pain.

## 2024-08-01 NOTE — Discharge Instructions (Addendum)
 Take the antibiotics twice daily with food for the next 3 days to extend your Augmentin from 7 days to 10.  For any pain you can take 600 mg of ibuprofen every 8 hours.  Return to clinic if no improvement over the next week or so.

## 2024-08-01 NOTE — Progress Notes (Signed)
  Because Anita Barrett, I feel your condition warrants further evaluation and I recommend that you be seen in a face-to-face visit.   NOTE: There will be NO CHARGE for this E-Visit   If you are having a true medical emergency, please call 911.     For an urgent face to face visit, [facility] has multiple urgent care centers for your convenience.  Click the link below for the full list of locations and hours, walk-in wait times, appointment scheduling options and driving directions:  Urgent Care - [place], [place], [place], [place], [place], [place]  [place]     Your MyChart E-visit questionnaire answers were reviewed by a board certified advanced clinical practitioner to complete your personal care plan based on your specific symptoms.    Thank you for using e-Visits.

## 2024-08-01 NOTE — ED Provider Notes (Signed)
 [facility]    CSN: [id number] Arrival date & time: 08/01/24  1308      History   Chief Complaint Chief Complaint  Patient presents with   Otalgia    HPI Anita Barrett is a 23 y.o. adult.   Patient presents to clinic with concern of left ear pain for the 3 to 4 weeks.  After a beach trip a few weeks ago patient noticed that her ear was hurting so they went to the mobile clinic.  Was diagnosed with an ear infection and placed on 7 days of Augmentin.  With using the Augmentin patient has had drastic improvement in ear pain.  Concerned because he finished antibiotics yesterday and left ear continues to hurt, feels like it radiates into the jaw.  Has not had any fevers or ear drainage.  The history is provided by the patient and medical records.  Otalgia   History reviewed. No pertinent past medical history.  Patient Active Problem List   Diagnosis Date Noted   Infective otitis externa of left ear 07/21/2024   Dysmenorrhea 04/12/2023   Chronic lower back pain 11/07/2022   Depression 11/07/2022   Healthcare maintenance 11/07/2022    History reviewed. No pertinent surgical history.  OB History   No obstetric history on file.      Home Medications    Prior to Admission medications   Medication Sig Start Date End Date Taking? Authorizing Provider  amoxicillin-clavulanate (AUGMENTIN) 875-125 MG tablet Take 1 tablet by mouth every 12 (twelve) hours for 3 days. 08/01/24 08/04/24 Yes Danamarie Minami  N, FNP  ibuprofen (ADVIL) 200 MG tablet Take 400 mg by mouth every 6 (six) hours as needed for moderate pain.    [provider]    Family History History reviewed. No pertinent family history.  Social History Social History   Tobacco Use   Smoking status: Never   Smokeless tobacco: Never  Vaping Use   Vaping status: Never Used  Substance Use Topics   Alcohol use: Never   Drug use: Yes    Types: Marijuana    Comment: THC     Allergies    Kiwi extract   Review of Systems Review of Systems  Per HPI  Physical Exam Triage Vital Signs ED Triage Vitals  Encounter Vitals Group     BP 08/01/24 1314 125/75     Girls Systolic BP Percentile --      Girls Diastolic BP Percentile --      Boys Systolic BP Percentile --      Boys Diastolic BP Percentile --      Pulse Rate 08/01/24 1314 78     Resp 08/01/24 1314 16     Temp 08/01/24 1314 97.8 F (36.6 C)     Temp Source 08/01/24 1314 Oral     SpO2 08/01/24 1314 98 %     Weight --      Height --      Head Circumference --      Peak Flow --      Pain Score 08/01/24 1320 5     Pain Loc --      Pain Education --      Exclude from Growth Chart --    No data found.  Updated Vital Signs BP 125/75 (BP Location: Right Arm)   Pulse 78   Temp 97.8 F (36.6 C) (Oral)   Resp 16   LMP 06/26/2024   SpO2 98%   Visual Acuity Right Eye Distance:  Left Eye Distance:   Bilateral Distance:    Right Eye Near:   Left Eye Near:    Bilateral Near:     Physical Exam Vitals and nursing note reviewed.  Constitutional:      Appearance: Normal appearance.  HENT:     Head: Normocephalic and atraumatic.     Right Ear: Tympanic membrane, ear canal and external ear normal.     Left Ear: Tympanic membrane, ear canal and external ear normal.     Nose: Nose normal.     Mouth/Throat:     Mouth: Mucous membranes are moist.  Eyes:     Conjunctiva/sclera: Conjunctivae normal.  Cardiovascular:     Rate and Rhythm: Normal rate.  Pulmonary:     Effort: Pulmonary effort is normal. No respiratory distress.  Skin:    General: Skin is warm and dry.  Neurological:     General: No focal deficit present.     Mental Status: He is alert and oriented to person, place, and time.  Psychiatric:        Mood and Affect: Mood normal.        Behavior: Behavior normal.      UC Treatments / Results  Labs (all labs ordered are listed, but only abnormal results are displayed) Labs Reviewed -  No data to display  EKG   Radiology No results found.  Procedures Procedures (including critical care time)  Medications Ordered in UC Medications - No data to display  Initial Impression / Assessment and Plan / UC Course  I have reviewed the triage vital signs and the nursing notes.  Pertinent labs & imaging results that were available during my care of the patient were reviewed by me and considered in my medical decision making (see chart for details).  Vitals and triage reviewed, patient is hemodynamically stable.  Bilateral tympanic membranes are pearly gray with good cone of light, no evidence of active bacterial otitis media or externa.  With continued pain and perhaps inaccurate antibiotic usage, will extend Augmentin for 3 more days for total of 10 days.  Encouraged to take twice daily with food.  Plan of care, follow-up care return precautions given, no questions at this time.    Final Clinical Impressions(s) / UC Diagnoses   Final diagnoses:  Acute otitis media, unspecified otitis media type     Discharge Instructions      Take the antibiotics twice daily with food for the next 3 days to extend your Augmentin from 7 days to 10.  For any pain you can take 600 mg of ibuprofen every 8 hours.  Return to clinic if no improvement over the next week or so.     ED Prescriptions     Medication Sig Dispense Auth. Provider   amoxicillin-clavulanate (AUGMENTIN) 875-125 MG tablet Take 1 tablet by mouth every 12 (twelve) hours for 3 days. 6 tablet Dreama, Zhion Pevehouse  N, FNP      PDMP not reviewed this encounter.   Dreama, Lameeka Schleifer  N, FNP 08/01/24 1340
